# Patient Record
Sex: Female | Born: 1950 | Race: White | Hispanic: No | Marital: Single | Smoking: Never smoker
Health system: Southern US, Community
[De-identification: ages and names within clinical notes are randomized; demographics above are authoritative.]

## PROBLEM LIST (undated history)

## (undated) DIAGNOSIS — I639 Cerebral infarction, unspecified: Secondary | ICD-10-CM

## (undated) DIAGNOSIS — I10 Essential (primary) hypertension: Secondary | ICD-10-CM

## (undated) DIAGNOSIS — R519 Headache, unspecified: Secondary | ICD-10-CM

## (undated) DIAGNOSIS — H539 Unspecified visual disturbance: Secondary | ICD-10-CM

## (undated) DIAGNOSIS — R51 Headache: Secondary | ICD-10-CM

## (undated) HISTORY — DX: Headache: R51

## (undated) HISTORY — PX: CHOLECYSTECTOMY: SHX55

## (undated) HISTORY — DX: Headache, unspecified: R51.9

## (undated) HISTORY — DX: Cerebral infarction, unspecified: I63.9

## (undated) HISTORY — PX: PARTIAL HYSTERECTOMY: SHX80

## (undated) HISTORY — DX: Essential (primary) hypertension: I10

## (undated) HISTORY — DX: Unspecified visual disturbance: H53.9

---

## 2015-02-03 DIAGNOSIS — M7520 Bicipital tendinitis, unspecified shoulder: Secondary | ICD-10-CM | POA: Insufficient documentation

## 2015-03-17 DIAGNOSIS — E668 Other obesity: Secondary | ICD-10-CM | POA: Insufficient documentation

## 2015-03-22 ENCOUNTER — Ambulatory Visit (INDEPENDENT_AMBULATORY_CARE_PROVIDER_SITE_OTHER): Payer: Medicare Other | Admitting: Neurology

## 2015-03-22 ENCOUNTER — Ambulatory Visit (INDEPENDENT_AMBULATORY_CARE_PROVIDER_SITE_OTHER): Payer: Self-pay | Admitting: *Deleted

## 2015-03-22 ENCOUNTER — Encounter: Payer: Self-pay | Admitting: Neurology

## 2015-03-22 VITALS — BP 130/90 | HR 66 | Resp 20 | Ht 60.0 in | Wt 236.4 lb

## 2015-03-22 DIAGNOSIS — G43701 Chronic migraine without aura, not intractable, with status migrainosus: Secondary | ICD-10-CM

## 2015-03-22 DIAGNOSIS — R51 Headache: Secondary | ICD-10-CM

## 2015-03-22 DIAGNOSIS — R413 Other amnesia: Secondary | ICD-10-CM | POA: Diagnosis not present

## 2015-03-22 DIAGNOSIS — R519 Headache, unspecified: Secondary | ICD-10-CM

## 2015-03-22 DIAGNOSIS — G4733 Obstructive sleep apnea (adult) (pediatric): Secondary | ICD-10-CM | POA: Diagnosis not present

## 2015-03-22 DIAGNOSIS — R9089 Other abnormal findings on diagnostic imaging of central nervous system: Secondary | ICD-10-CM | POA: Insufficient documentation

## 2015-03-22 DIAGNOSIS — R930 Abnormal findings on diagnostic imaging of skull and head, not elsewhere classified: Secondary | ICD-10-CM

## 2015-03-22 DIAGNOSIS — Z9989 Dependence on other enabling machines and devices: Secondary | ICD-10-CM

## 2015-03-22 DIAGNOSIS — F4323 Adjustment disorder with mixed anxiety and depressed mood: Secondary | ICD-10-CM | POA: Insufficient documentation

## 2015-03-22 MED ORDER — KETOROLAC TROMETHAMINE 30 MG/ML IJ SOLN
60.0000 mg | Freq: Once | INTRAMUSCULAR | Status: AC
  Administered 2015-03-22: 60 mg via INTRAMUSCULAR

## 2015-03-22 MED ORDER — TOPIRAMATE 100 MG PO TABS: 200.0000 mg | ORAL_TABLET | Freq: Every day | ORAL | Status: DC

## 2015-03-22 NOTE — Progress Notes (Signed)
[facility]  PATIENT: Jeanne White DOB: [birth year]  REFERRING DOCTOR OR PCP:  Brooke Bonito SOURCE: Patient and records from [facility]  _________________________________   HISTORICAL  CHIEF COMPLAINT:  Chief Complaint  Patient presents with  . Cerebrovascular Accident    Former pt. of Dr. Bonnita Hollow from [facility].  She denies new stroke sx.  Sts. memory is ok right now.  Today she c/o frequent, almost daily h/a's.  Sts. weather changes, hot or cold air trigger h/a's.  She sts. Trokendi has helped in the past, (more than Topirimate), but she stopped it due to the $50 copay.  She would like to discuss restarting this or another daily preventative./fim  . Memory Loss  . Headaches    HISTORY OF PRESENT ILLNESS:  Jeanne White is a 64 yo woman with migraine headaches, memory loss and a very abnormal MRI of the brain.  Migraines:   She has had headaches her entire adult life, worse over the past 10 years, right more than left.   Her right ear often hurts when her pain is worse.   Often, the headache is mild in the morning and intensifies as the day goes on.   Vision becomes blurry when pain is worse.   Moving her head or looking down will worsen the pain.   In the past, Trokendi worked fairly well and definitely better than immediate release topiramate.   Due to cost, she is back on topiramate but is only taking a couple days a week.   Weather / temp changes will trigger migraines or intensify existing pain.    Memory loss has been more of a problem the past few years.   She also had trouble comprehending at work and stopped in 2014.   She feels this continues to slowly worsen.     Mood:  She notes mild depression at times but more issues with anxiety and has occasional panic attacks  In 2007, she was diagnosed with Bells palsy.   She had an MRI showing severe white matter changes and was referred to Dr. Leotis Shames who diagnosed her with MS  and started Copaxone.   She felt worse and began to see me in 2009 or so.   In the past, I reviewed her MRI images from 04/02/2014. There is cortical and cerebellar atrophy and extensive white matter hyperintensity. There is some progression compared to the MRI of the brain dated 08/24/2010.  Notch 3 (CADASIL) an COL4A1 testing were negative.  Interestingly, her father has hereditary hemorrhagic telangiectasia  She also has OSA treated with CPAP  REVIEW OF SYSTEMS: Constitutional: No fevers, chills, sweats, or change in appetite Eyes: No visual changes, double vision, eye pain Ear, nose and throat: No hearing loss, ear pain, nasal congestion, sore throat Cardiovascular: No chest pain, palpitations Respiratory: No shortness of breath at rest or with exertion.   No wheezes GastrointestinaI: No nausea, vomiting, diarrhea, abdominal pain, fecal incontinence Genitourinary: No dysuria, urinary retention or frequency.  No nocturia. Musculoskeletal: No neck pain, back pain Integumentary: No rash, pruritus, skin lesions Neurological: as above Psychiatric: No depression at this time.  No anxiety Endocrine: No palpitations, diaphoresis, change in appetite, change in weigh or increased thirst Hematologic/Lymphatic: No anemia, purpura, petechiae. Allergic/Immunologic: No itchy/runny eyes, nasal congestion, recent allergic reactions, rashes  ALLERGIES: No Known Allergies  HOME MEDICATIONS:  Current outpatient prescriptions:  .  amLODipine (NORVASC) 5 MG tablet, TK 1 T PO  D FOR BLOOD PRESSURE, Disp: , Rfl: 11 .  hydrochlorothiazide (HYDRODIURIL) 25 MG tablet, TK 1 T PO  D, Disp: , Rfl: 5 .  levothyroxine (SYNTHROID, LEVOTHROID) 150 MCG tablet, Take 150 mcg by mouth daily before breakfast., Disp: , Rfl:  .  losartan (COZAAR) 25 MG tablet, TK 1 T PO  QD, Disp: , Rfl: 0  PAST MEDICAL HISTORY: Past Medical History  Diagnosis Date  . Headache   . Hypertension   . Stroke   . Vision abnormalities      PAST SURGICAL HISTORY: Past Surgical History  Procedure Laterality Date  . Partial hysterectomy    . Cholecystectomy      FAMILY HISTORY: Family History  Problem Relation Age of Onset  . Heart disease Mother   . Hypertension Father   . Heart disease Father     SOCIAL HISTORY:  History   Social History  . Marital Status: Single    Spouse Name: N/A  . Number of Children: N/A  . Years of Education: N/A   Occupational History  . Not on file.   Social History Main Topics  . Smoking status: Never Smoker   . Smokeless tobacco: Not on file  . Alcohol Use: No  . Drug Use: No  . Sexual Activity: Not on file   Other Topics Concern  . Not on file   Social History Narrative  . No narrative on file     PHYSICAL EXAM  Filed Vitals:   03/22/15 1438  BP: 130/90  Pulse: 66  Resp: 20  Height: 5' (1.524 m)  Weight: 236 lb 6.4 oz (107.23 kg)    Body mass index is 46.17 kg/(m^2).   General: The patient is well-developed and well-nourished and in no acute distress  Eyes:  Funduscopic exam shows normal optic discs and retinal vessels.  Neck: The neck is supple, no carotid bruits are noted.  The neck is nontender.  Cardiovascular: The heart has a regular rate and rhythm with a normal S1 and S2. There were no murmurs, gallops or rubs. Lungs are clear to auscultation.  Skin: Extremities are without significant edema.  Musculoskeletal:  Back is nontender  Neurologic Exam  Mental status: The patient is alert and oriented x 3 at the time of the examination. The patient has apparent normal recent and remote memory, with an apparently normal attention span and concentration ability.   Speech is normal.  Cranial nerves: Extraocular movements are full. Pupils are equal, round, and reactive to light and accomodation.  Visual fields are full.  Facial symmetry is present. There is good facial sensation to soft touch bilaterally. Facial strength is normal.  Trapezius and  sternocleidomastoid strength is normal. No dysarthria is noted.  The tongue is midline, and the patient has symmetric elevation of the soft palate. No obvious hearing deficits are noted.  Motor:  Muscle bulk is normal.   Tone is normal. Strength is  5 / 5 in all 4 extremities.   Sensory: Sensory testing is intact to pinprick, soft touch and vibration sensation in all 4 extremities.  Coordination: Cerebellar testing reveals good finger-nose-finger and heel-to-shin bilaterally.  Gait and station: Station is normal.   Gait is normal. Tandem gait is wide. Romberg is negative.   Reflexes: Deep tendon reflexes are symmetric and normal bilaterally.   Plantar responses are flexor.    DIAGNOSTIC DATA (LABS, IMAGING, TESTING) - I reviewed patient records, labs, notes, testing and imaging myself where available.     ASSESSMENT AND PLAN  Chronic migraine without aura with status migrainosus,  not intractable  Memory loss  Abnormal brain MRI  Adjustment disorder with mixed anxiety and depressed mood  OSA on CPAP    1.   Topiramate 200 mg by mouth nightly for chronic migraine. If she experiences worsening of her memory difficulties, consider changing to trochanter deep. 2.  Aspirin 81 mg by mouth daily 3.  She will return to see me in 6 months or sooner if she has new or worsening neurologic symptoms. We will check an MRI of the brain around the time of her next visit.  45 minute face-to-face evaluation with greater than one half of the time counseling according care about her symptoms white matter disease and possible genetic component. toradol Mri next visit   Madalina Rosman A. Epimenio FootSater, MD, PhD 03/22/2015, 2:49 PM Certified in Neurology, Clinical Neurophysiology, Sleep Medicine, Pain Medicine and Neuroimaging  [facility] [address], [address] [place], [place] [address] [phone]

## 2015-03-22 NOTE — Progress Notes (Signed)
Toradol  IM given RUOG/fim

## 2015-09-22 ENCOUNTER — Ambulatory Visit: Payer: Medicare Other | Admitting: Neurology

## 2015-09-29 ENCOUNTER — Encounter: Payer: Self-pay | Admitting: Neurology

## 2015-09-29 ENCOUNTER — Ambulatory Visit (INDEPENDENT_AMBULATORY_CARE_PROVIDER_SITE_OTHER): Payer: Medicare Other | Admitting: Neurology

## 2015-09-29 VITALS — BP 140/86 | HR 74 | Resp 16 | Ht 60.0 in | Wt 222.6 lb

## 2015-09-29 DIAGNOSIS — Z9989 Dependence on other enabling machines and devices: Secondary | ICD-10-CM

## 2015-09-29 DIAGNOSIS — G43701 Chronic migraine without aura, not intractable, with status migrainosus: Secondary | ICD-10-CM

## 2015-09-29 DIAGNOSIS — R930 Abnormal findings on diagnostic imaging of skull and head, not elsewhere classified: Secondary | ICD-10-CM

## 2015-09-29 DIAGNOSIS — G4733 Obstructive sleep apnea (adult) (pediatric): Secondary | ICD-10-CM

## 2015-09-29 DIAGNOSIS — R9089 Other abnormal findings on diagnostic imaging of central nervous system: Secondary | ICD-10-CM

## 2015-09-29 DIAGNOSIS — R413 Other amnesia: Secondary | ICD-10-CM

## 2015-09-29 MED ORDER — TOPIRAMATE ER 200 MG PO CAP24: ORAL_CAPSULE | ORAL | Status: DC

## 2015-09-29 NOTE — Progress Notes (Signed)
[facility]  PATIENT: Jeanne White DOB: [birth year]  REFERRING DOCTOR OR PCP:  Brooke Bonito SOURCE: Patient and records from [facility]  _________________________________   HISTORICAL  CHIEF COMPLAINT:  Chief Complaint  Patient presents with  . History of CVA    Denies new stroke sx.  Sts. she has not noted a difference in h/a's since starting Topamax.  Sts. h/a's are almost daily, about the same severity.  She believes h/a's are better if she stays inside, worse if she goes outside./fim  . Memory Loss  . Migraines    HISTORY OF PRESENT ILLNESS:  Jeanne White is a 66 yo woman with migraine headaches, memory loss and a very abnormal MRI of the brain.  Migraines:   She has had headaches her entire adult life, worse over the past 10 years, right more than left.    Headaches are daily.   She often wakes up with one.  Vision is sometimes blurry before headache and sometimes becomes blurry when pain is worse.   Moving her head or looking down will worsen the pain. She often gets nausea but rarely has vomiting.   Trokendi worked much better than topiramate (200 mg nightly).   She gets much more sleepy with the topiramate.      Weather / temp changes will trigger migraines or intensify existing pain.     Imitrex and other triptans have not helped.   Excedrin migraine sometimes helps a short time.     Memory loss is still a problem and has worsened.   .   She also had trouble comprehending at work and stopped in 2014.  She started a part time job but has some problems at times.   She feels memory problems continue to slowly worsen.     Mood:  She notes mild depression at times but more issues with anxiety and has occasional panic attacks.  She had a recent panic attack at a part time job, lasting about 15-30 minutes.    She feels she can't remember anything when the panic is there.     History:   In 2007, she was diagnosed with Bells palsy.   She had an  MRI showing severe white matter changes and was referred to Dr. Leotis Shames who diagnosed her with MS and started Copaxone.   She felt worse and began to see me in 2009 or so.   In the past, I reviewed her MRI images from 04/02/2014. There is cortical and cerebellar atrophy and extensive white matter hyperintensity. There is some progression compared to the MRI of the brain dated 08/24/2010.  Notch 3 (CADASIL) an COL4A1 testing were negative.  Interestingly, her father has hereditary hemorrhagic telangiectasia  She also has OSA treated with CPAP  REVIEW OF SYSTEMS: Constitutional: No fevers, chills, sweats, or change in appetite Eyes: No visual changes, double vision, eye pain Ear, nose and throat: No hearing loss, ear pain, nasal congestion, sore throat Cardiovascular: No chest pain, palpitations Respiratory: No shortness of breath at rest or with exertion.   No wheezes GastrointestinaI: No nausea, vomiting, diarrhea, abdominal pain, fecal incontinence Genitourinary: No dysuria, urinary retention or frequency.  No nocturia. Musculoskeletal: No neck pain, back pain Integumentary: No rash, pruritus, skin lesions Neurological: as above Psychiatric: No depression at this time.  No anxiety Endocrine: No palpitations, diaphoresis, change in appetite, change in weigh or increased thirst Hematologic/Lymphatic: No anemia, purpura, petechiae. Allergic/Immunologic: No itchy/runny eyes, nasal congestion, recent allergic reactions, rashes  ALLERGIES: No  Known Allergies  HOME MEDICATIONS:  Current outpatient prescriptions:  .  amLODipine (NORVASC) 5 MG tablet, TK 1 T PO  D FOR BLOOD PRESSURE, Disp: , Rfl: 11 .  hydrochlorothiazide (HYDRODIURIL) 25 MG tablet, TK 1 T PO  D, Disp: , Rfl: 5 .  levothyroxine (SYNTHROID, LEVOTHROID) 150 MCG tablet, Take 150 mcg by mouth daily before breakfast., Disp: , Rfl:  .  losartan (COZAAR) 25 MG tablet, TK 1 T PO  QD, Disp: , Rfl: 0 .  topiramate (TOPAMAX) 100 MG  tablet, Take 2 tablets (200 mg total) by mouth at bedtime., Disp: 60 tablet, Rfl: 11  PAST MEDICAL HISTORY: Past Medical History  Diagnosis Date  . Headache   . Hypertension   . Stroke (HCC)   . Vision abnormalities     PAST SURGICAL HISTORY: Past Surgical History  Procedure Laterality Date  . Partial hysterectomy    . Cholecystectomy      FAMILY HISTORY: Family History  Problem Relation Age of Onset  . Heart disease Mother   . Hypertension Father   . Heart disease Father     SOCIAL HISTORY:  Social History   Social History  . Marital Status: Single    Spouse Name: N/A  . Number of Children: N/A  . Years of Education: N/A   Occupational History  . Not on file.   Social History Main Topics  . Smoking status: Never Smoker   . Smokeless tobacco: Not on file  . Alcohol Use: No  . Drug Use: No  . Sexual Activity: Not on file   Other Topics Concern  . Not on file   Social History Narrative     PHYSICAL EXAM  Filed Vitals:   09/29/15 1528  BP: 140/86  Pulse: 74  Resp: 16  Height: 5' (1.524 m)  Weight: 222 lb 9.6 oz (100.971 kg)    Body mass index is 43.47 kg/(m^2).   General: The patient is well-developed and well-nourished and in no acute distress  Neck: The neck is mildly tender.  Skin: Extremities are without significant edema.  Neurologic Exam  Mental status: The patient is alert and oriented x 3 at the time of the examination. The patient has apparent normal recent and remote memory, with an apparently normal attention span and concentration ability.   Speech is normal.  Cranial nerves: Extraocular movements are full. Pupils are equal, round, and reactive to light and accomodation.   There is good facial sensation to soft touch bilaterally. Facial strength is normal.  Trapezius and sternocleidomastoid strength is normal. No dysarthria is noted.  The tongue is midline, and the patient has symmetric elevation of the soft palate. No obvious  hearing deficits are noted.  Motor:  Muscle bulk is normal.   Tone is normal. Strength is  5 / 5 in all 4 extremities.   Sensory: Sensory testing is intact to touch and vibration sensation in all 4 extremities.  Coordination: Cerebellar testing reveals good finger-nose-finger and heel-to-shin bilaterally.  Gait and station: Station is normal.   Gait is normal. Tandem gait is wide. Romberg is negative.   Reflexes: Deep tendon reflexes are symmetric and normal bilaterally.  Marland Kitchen.    DIAGNOSTIC DATA (LABS, IMAGING, TESTING) - I reviewed patient records, labs, notes, testing and imaging myself where available.     ASSESSMENT AND PLAN  Chronic migraine without aura with status migrainosus, not intractable  OSA on CPAP  Abnormal brain MRI  Memory loss    1.  Change topiramate to Trokendi XR.   If not covered, consider Keppra 2.   Aspirin 81 mg by mouth daily 3.  Continue CPAP 4.   She will return to see me in 6 months or sooner if she has new or worsening neurologic symptoms. We will check an MRI of the brain around the time of her next visit.   Fannye Myer A. Epimenio Foot, MD, PhD 09/29/2015, 3:32 PM Certified in Neurology, Clinical Neurophysiology, Sleep Medicine, Pain Medicine and Neuroimaging  [facility] [address], [address] [place], [place] [address] [phone]

## 2015-09-30 ENCOUNTER — Telehealth: Payer: Self-pay | Admitting: Neurology

## 2015-09-30 MED ORDER — LEVETIRACETAM 750 MG PO TABS: 750.0000 mg | ORAL_TABLET | Freq: Two times a day (BID) | ORAL | Status: DC

## 2015-09-30 NOTE — Telephone Encounter (Signed)
Pt called and says the drug company will be faxing a form for the physician to fill out for the pt. She can not afford the medication and would like to know if something else can be prescribed until the form is filled out and sent back. Please call and advise [phone]

## 2015-09-30 NOTE — Telephone Encounter (Signed)
LMOM that per RAS, will switch her to Keppra 750mg  po bid.  Rx. escribed to [facility] in [place].  Pt. does not need to return this call unless she has questions/fim

## 2015-10-03 DIAGNOSIS — I10 Essential (primary) hypertension: Secondary | ICD-10-CM | POA: Insufficient documentation

## 2015-10-03 DIAGNOSIS — F329 Major depressive disorder, single episode, unspecified: Secondary | ICD-10-CM | POA: Insufficient documentation

## 2015-10-03 DIAGNOSIS — J45909 Unspecified asthma, uncomplicated: Secondary | ICD-10-CM | POA: Insufficient documentation

## 2015-10-03 DIAGNOSIS — E039 Hypothyroidism, unspecified: Secondary | ICD-10-CM | POA: Insufficient documentation

## 2015-10-14 NOTE — Telephone Encounter (Signed)
Patient called regarding levETIRAcetam (KEPPRA) 750 MG tablet, states the medication is helping her headaches, however she has been on KEPPRA x 3 weeks and has side effect of staggering when she gets up, she is working part time at [employer] and Monday she was staggering so bad, she had to have someone come get her. Wonders if she could cut back to 1/2 dose. Please call to advise [phone].

## 2015-10-14 NOTE — Telephone Encounter (Signed)
I have spoken with Jeanne White this afternoon and per RAS, advised she decrease Keppra to 1/2 tab bid.  She is agreeable with this plan, and will call me back if se do not improved/fim

## 2015-12-05 ENCOUNTER — Ambulatory Visit (INDEPENDENT_AMBULATORY_CARE_PROVIDER_SITE_OTHER): Payer: Medicare Other | Admitting: *Deleted

## 2015-12-05 ENCOUNTER — Telehealth: Payer: Self-pay | Admitting: Neurology

## 2015-12-05 DIAGNOSIS — R51 Headache: Secondary | ICD-10-CM

## 2015-12-05 DIAGNOSIS — R519 Headache, unspecified: Secondary | ICD-10-CM

## 2015-12-05 MED ORDER — KETOROLAC TROMETHAMINE 60 MG/2ML IM SOLN
60.0000 mg | Freq: Once | INTRAMUSCULAR | Status: AC
Start: 2015-12-05 — End: 2015-12-05
  Administered 2015-12-05: 60 mg via INTRAMUSCULAR

## 2015-12-05 NOTE — Telephone Encounter (Signed)
I have spoken with Jeanne White this morning, and per RAS ok, have given appt. for T60 IM 3pm today.  Added to RN schedule/fim

## 2015-12-05 NOTE — Telephone Encounter (Signed)
Pt called sts HA's have increased in frequency about 1 mth ago. sts HA's had improved but she thinks the weather change had caused HA's to get worse. She is asking for injection. Please call

## 2016-03-29 ENCOUNTER — Encounter: Payer: Self-pay | Admitting: Neurology

## 2016-03-29 ENCOUNTER — Ambulatory Visit (INDEPENDENT_AMBULATORY_CARE_PROVIDER_SITE_OTHER): Payer: Medicare Other | Admitting: Neurology

## 2016-03-29 VITALS — BP 128/76 | HR 74 | Resp 16 | Ht 60.0 in | Wt 237.0 lb

## 2016-03-29 DIAGNOSIS — F4323 Adjustment disorder with mixed anxiety and depressed mood: Secondary | ICD-10-CM | POA: Diagnosis not present

## 2016-03-29 DIAGNOSIS — R413 Other amnesia: Secondary | ICD-10-CM | POA: Diagnosis not present

## 2016-03-29 DIAGNOSIS — G43701 Chronic migraine without aura, not intractable, with status migrainosus: Secondary | ICD-10-CM

## 2016-03-29 DIAGNOSIS — G4733 Obstructive sleep apnea (adult) (pediatric): Secondary | ICD-10-CM

## 2016-03-29 DIAGNOSIS — M542 Cervicalgia: Secondary | ICD-10-CM | POA: Diagnosis not present

## 2016-03-29 DIAGNOSIS — Z9989 Dependence on other enabling machines and devices: Principal | ICD-10-CM

## 2016-03-29 MED ORDER — CYCLOBENZAPRINE HCL 5 MG PO TABS: 5.0000 mg | ORAL_TABLET | Freq: Every day | ORAL | Status: DC

## 2016-03-29 NOTE — Progress Notes (Signed)
[facility]  PATIENT: Jeanne White DOB: September 23, 1950  REFERRING DOCTOR OR PCP:  Brooke Bonito SOURCE: Patient and records from [facility]  _________________________________   HISTORICAL  CHIEF COMPLAINT:  Chief Complaint  Patient presents with  . Sleep Apnea    Sts. she remains compliant with CPAP. Sts. h/a's are not any better with Keppra./fim  . History of CVA    HISTORY OF PRESENT ILLNESS:  Jeanne White is a 65 yo woman with migraine headaches, memory loss and a very abnormal MRI of the brain.  She is getting migraines daily now and is feeling much worse.      Migraines:   She is reporting daily headaches now (30/30 days) with pain > 4 hours a day.   She wakes up with pain most (but not all) days.   She has had headaches her entire adult life, worse over the past 10 years, right more than left.  Vision is sometimes blurry before headache and sometimes becomes blurry when pain is worse.   Moving her head or looking down will worsen the pain. She often gets nausea but rarely has vomiting.   Keppra has helped a little bit.   Heat (air) or wind/air will  trigger migraines or intensify existing pain.  A heating pad sometimes     Imitrex and other triptans have not helped.   Excedrin migraine sometimes helps a short time.     Medications tried and failed for chronic Migraine:    Antiepileptics (Topamax, Keppra).  Due to obesity can not go on Depakote. Beta blockers (metoprolol); Calcium channel blockers (Norvasc) Antidepressants (Lexapro, others in past) NSAIDs - ibuprofen, naproxen, Excedrin Migraine Triptans - Imitrex, Relpax  Memory:    Memory loss is still a problem and has worsened over the past couple years.   .   She had trouble comprehending at work and stopped in 2014.  She started a part time job but had some problems at times.   She feels memory problems continue to slowly worsen.    She also notes decreased focus and  attention.  Mood:  She notes mild depression at times and mild anxiety and has occasional panic attacks.  She gets upset and frustrated easily.    No panic attacks since las visit.   She feels she can't remember anything when more anxious.     OSA:   She also has OSA treated with CPAP.  She is very compliant and uses it nightly. She sleeps much better when she wears it than when she does not.  Abnormal MRI History:   In 2007, she was diagnosed with Bells palsy.   She had an MRI showing severe white matter changes and was referred to Dr. Leotis Shames who diagnosed her with MS and started Copaxone.   She felt worse and began to see me in 2009 or so.   In the past, I reviewed her MRI images from 04/02/2014. There is cortical and cerebellar atrophy and extensive white matter hyperintensity. There is some progression compared to the MRI of the brain dated 08/24/2010.  Notch 3 (CADASIL) an COL4A1 testing were negative.  Interestingly, her father has hereditary hemorrhagic telangiectasia   REVIEW OF SYSTEMS: Constitutional: No fevers, chills, sweats, or change in appetite Eyes: No visual changes, double vision, eye pain Ear, nose and throat: No hearing loss, ear pain, nasal congestion, sore throat Cardiovascular: No chest pain, palpitations Respiratory: No shortness of breath at rest or with exertion.   No wheezes GastrointestinaI:  No nausea, vomiting, diarrhea, abdominal pain, fecal incontinence Genitourinary: No dysuria, urinary retention or frequency.  No nocturia. Musculoskeletal: No neck pain, back pain Integumentary: No rash, pruritus, skin lesions Neurological: as above Psychiatric: No depression at this time.  No anxiety Endocrine: No palpitations, diaphoresis, change in appetite, change in weigh or increased thirst Hematologic/Lymphatic: No anemia, purpura, petechiae. Allergic/Immunologic: No itchy/runny eyes, nasal congestion, recent allergic reactions, rashes  ALLERGIES: Allergies   Allergen Reactions  . Mometasone Other (See Comments)    Unknown    HOME MEDICATIONS:  Current outpatient prescriptions:  .  amLODipine (NORVASC) 5 MG tablet, TK 1 T PO  D FOR BLOOD PRESSURE, Disp: , Rfl: 11 .  hydrochlorothiazide (HYDRODIURIL) 25 MG tablet, TK 1 T PO  D, Disp: , Rfl: 5 .  levETIRAcetam (KEPPRA) 750 MG tablet, Take 1 tablet (750 mg total) by mouth 2 (two) times daily., Disp: 120 tablet, Rfl: 11 .  levothyroxine (SYNTHROID, LEVOTHROID) 150 MCG tablet, Take 150 mcg by mouth daily before breakfast., Disp: , Rfl:  .  losartan (COZAAR) 25 MG tablet, TK 1 T PO  QD, Disp: , Rfl: 0  PAST MEDICAL HISTORY: Past Medical History  Diagnosis Date  . Headache   . Hypertension   . Stroke (HCC)   . Vision abnormalities     PAST SURGICAL HISTORY: Past Surgical History  Procedure Laterality Date  . Partial hysterectomy    . Cholecystectomy      FAMILY HISTORY: Family History  Problem Relation Age of Onset  . Heart disease Mother   . Hypertension Father   . Heart disease Father     SOCIAL HISTORY:  Social History   Social History  . Marital Status: Single    Spouse Name: N/A  . Number of Children: N/A  . Years of Education: N/A   Occupational History  . Not on file.   Social History Main Topics  . Smoking status: Never Smoker   . Smokeless tobacco: Not on file  . Alcohol Use: No  . Drug Use: No  . Sexual Activity: Not on file   Other Topics Concern  . Not on file   Social History Narrative     PHYSICAL EXAM  Filed Vitals:   03/29/16 1607  BP: 128/76  Pulse: 74  Resp: 16  Height: 5' (1.524 m)  Weight: 237 lb (107.502 kg)    Body mass index is 46.29 kg/(m^2).   General: The patient is well-developed and well-nourished and in no acute distress  Neck: The neck is mildly tender.  Skin: Extremities are without significant edema.  Neurologic Exam  Mental status: The patient is alert and oriented x 3 at the time of the examination. The  patient has apparent normal recent and remote memory, with an apparently normal attention span and concentration ability.   Speech is normal.  Cranial nerves: Extraocular movements are full. Pupils are equal, round, and reactive to light and accomodation.   There is good facial sensation to soft touch bilaterally. Facial strength is normal.  Trapezius and sternocleidomastoid strength is normal. No dysarthria is noted.  The tongue is midline, and the patient has symmetric elevation of the soft palate. No obvious hearing deficits are noted.  Motor:  Muscle bulk is normal.   Tone is normal. Strength is  5 / 5 in all 4 extremities.   Sensory: Sensory testing is intact to touch and vibration sensation in all 4 extremities.  Coordination: Cerebellar testing reveals good finger-nose-finger and heel-to-shin bilaterally.  Gait and station: Station is normal.   Gait is is minimally wide. Tandem gait is wide. Romberg is negative.   Reflexes: Deep tendon reflexes are increased in legs bilaterally.  Marland Kitchen.    DIAGNOSTIC DATA (LABS, IMAGING, TESTING) - I reviewed patient records, labs, notes, testing and imaging myself where available.     ASSESSMENT AND PLAN  OSA on CPAP  Chronic migraine without aura with status migrainosus, not intractable  Adjustment disorder with mixed anxiety and depressed mood  Memory loss  Neck pain    1.   Continue Keppra.   Add cyclobenzaprine to help pain / sleep 2.   Aspirin 81 mg by mouth daily 3.   Continue CPAP 4.   Bilateral splenius capitis muscle injection with 80 mg Depo-Medrol in marcaine. 5.   We discussed Botox for her chronic migriane refractory to treatment with other medications.    We will go ahead and have her sign out. 6.   She will return to see me in 4 months or sooner if she has new or worsening neurologic symptoms. If she has more headaches or memory decline consider a an MRI at next visit   45 minute face-to-face evaluation with greater than  one half of the time counseling or coordinating care about her headaches and memory loss.  Shemia Bevel A. Epimenio FootSater, MD, PhD 03/29/2016, 4:10 PM Certified in Neurology, Clinical Neurophysiology, Sleep Medicine, Pain Medicine and Neuroimaging  [facility] [address], [address] [place], [place] [id number] [phone]

## 2016-05-03 ENCOUNTER — Telehealth: Payer: Self-pay | Admitting: Neurology

## 2016-05-03 NOTE — Telephone Encounter (Signed)
Pt called wanting to know the status of the insurance approval for botox. Please call

## 2016-05-07 NOTE — Telephone Encounter (Signed)
Pt called in to check on status of botox . Please call pt

## 2016-05-08 NOTE — Telephone Encounter (Signed)
Called patient and schedule apt.  °

## 2016-05-16 ENCOUNTER — Encounter: Payer: Self-pay | Admitting: Neurology

## 2016-05-16 ENCOUNTER — Ambulatory Visit (INDEPENDENT_AMBULATORY_CARE_PROVIDER_SITE_OTHER): Payer: Medicare Other | Admitting: Neurology

## 2016-05-16 VITALS — BP 158/82 | HR 84 | Resp 18 | Ht 60.0 in | Wt 240.0 lb

## 2016-05-16 DIAGNOSIS — R9089 Other abnormal findings on diagnostic imaging of central nervous system: Secondary | ICD-10-CM

## 2016-05-16 DIAGNOSIS — F411 Generalized anxiety disorder: Secondary | ICD-10-CM | POA: Diagnosis not present

## 2016-05-16 DIAGNOSIS — G43701 Chronic migraine without aura, not intractable, with status migrainosus: Secondary | ICD-10-CM | POA: Diagnosis not present

## 2016-05-16 DIAGNOSIS — G4733 Obstructive sleep apnea (adult) (pediatric): Secondary | ICD-10-CM | POA: Diagnosis not present

## 2016-05-16 DIAGNOSIS — R930 Abnormal findings on diagnostic imaging of skull and head, not elsewhere classified: Secondary | ICD-10-CM

## 2016-05-16 DIAGNOSIS — Z9989 Dependence on other enabling machines and devices: Secondary | ICD-10-CM

## 2016-05-16 DIAGNOSIS — R413 Other amnesia: Secondary | ICD-10-CM

## 2016-05-16 NOTE — Progress Notes (Signed)
[facility]  PATIENT: Jeanne White DOB: 05-13-1951  REFERRING DOCTOR OR PCP:  Brooke BonitoWarren Gallemore SOURCE: Patient and records from [facility]  _________________________________   HISTORICAL  CHIEF COMPLAINT:  Chief Complaint  Patient presents with  . Migraines    Here today for Botox inj/fim    HISTORY OF PRESENT ILLNESS:  Jeanne White is a 65 yo woman with chronic migraine headaches.   She also has memory loss and a very abnormal MRI of the brain.     Migraines:   Currently, she is reporting daily headaches (30/30 days) with pain > 4 hours a day. Headaches are usually worse on the right than the left  She wakes up with pain most days.   She has had headaches X 40-50 years, worse over the past 10 years, right more than left.  Vision is sometimes blurry before headache and sometimes becomes blurry when pain is worse.   Moving her head or looking down will worsen the pain. She often gets nausea but rarely has vomiting.   Keppra has helped a little bit.   Heat and wind  Triggers a migraine or intensifies existing pain.    Imitrex and other triptans have not helped.   Excedrin migraine sometimes helps a short time.   An occipital nerve block/splenius capitis muscle injection helped the pain for a week or 2.  Medications tried and failed for chronic Migraine:    Antiepileptics (Topamax, Keppra).  Due to obesity can not go on Depakote. Beta blockers (metoprolol); Calcium channel blockers (Norvasc) Antidepressants (Lexapro, others in past) NSAIDs - ibuprofen, naproxen, Excedrin Migraine Triptans - Imitrex, Relpax  Memory:    She feels about the same as her last visit though memory has worsened over the past couple of years. She stopped working in 2014 due to memory difficulties.  She feels memory problems are progressive.    She also notes decreased focus and attention.  Mood:  She notes mild depression and moderate anxiety with panic attacks.  She gets  upset and frustrated easily.     OSA:   She also has OSA treated with CPAP.  She is very compliant and uses it nightly. She sleeps much better when she wears it than when she does not.  Abnormal MRI History:   In 2007, she was diagnosed with Bells palsy.   She had an MRI showing severe white matter changes and was referred to Dr. Leotis ShamesJeffery who diagnosed her with MS and started Copaxone.   She felt worse and began to see me in 2009 or so.   In the past, I reviewed her MRI images from 04/02/2014. There is cortical and cerebellar atrophy and extensive white matter hyperintensity. There is some progression compared to the MRI of the brain dated 08/24/2010.  Notch 3 (CADASIL) and COL4A1 testing were negative.  Her father has hereditary hemorrhagic telangiectasia   REVIEW OF SYSTEMS: Constitutional: No fevers, chills, sweats, or change in appetite Eyes: No visual changes, double vision, eye pain Ear, nose and throat: No hearing loss, ear pain, nasal congestion, sore throat Cardiovascular: No chest pain, palpitations Respiratory: No shortness of breath at rest or with exertion.   No wheezes GastrointestinaI: No nausea, vomiting, diarrhea, abdominal pain, fecal incontinence Genitourinary: No dysuria, urinary retention or frequency.  No nocturia. Musculoskeletal: No neck pain, back pain Integumentary: No rash, pruritus, skin lesions Neurological: as above Psychiatric: No depression at this time.  No anxiety Endocrine: No palpitations, diaphoresis, change in appetite, change in weigh  or increased thirst Hematologic/Lymphatic: No anemia, purpura, petechiae. Allergic/Immunologic: No itchy/runny eyes, nasal congestion, recent allergic reactions, rashes  ALLERGIES: Allergies  Allergen Reactions  . Mometasone Other (See Comments)    Unknown    HOME MEDICATIONS:  Current Outpatient Prescriptions:  .  amLODipine (NORVASC) 5 MG tablet, TK 1 T PO  D FOR BLOOD PRESSURE, Disp: , Rfl: 11 .   cyclobenzaprine (FLEXERIL) 5 MG tablet, Take 1 tablet (5 mg total) by mouth at bedtime., Disp: 30 tablet, Rfl: 5 .  escitalopram (LEXAPRO) 20 MG tablet, Take 20 mg by mouth daily., Disp: , Rfl:  .  hydrochlorothiazide (HYDRODIURIL) 25 MG tablet, TK 1 T PO  D, Disp: , Rfl: 5 .  levothyroxine (SYNTHROID, LEVOTHROID) 150 MCG tablet, Take 150 mcg by mouth daily before breakfast., Disp: , Rfl:  .  losartan (COZAAR) 25 MG tablet, TK 1 T PO  QD, Disp: , Rfl: 0 .  metoprolol succinate (TOPROL-XL) 100 MG 24 hr tablet, Take 100 mg by mouth daily. Take with or immediately following a meal., Disp: , Rfl:   PAST MEDICAL HISTORY: Past Medical History:  Diagnosis Date  . Headache   . Hypertension   . Stroke (HCC)   . Vision abnormalities     PAST SURGICAL HISTORY: Past Surgical History:  Procedure Laterality Date  . CHOLECYSTECTOMY    . PARTIAL HYSTERECTOMY      FAMILY HISTORY: Family History  Problem Relation Age of Onset  . Heart disease Mother   . Hypertension Father   . Heart disease Father     SOCIAL HISTORY:  Social History   Social History  . Marital status: Single    Spouse name: N/A  . Number of children: N/A  . Years of education: N/A   Occupational History  . Not on file.   Social History Main Topics  . Smoking status: Never Smoker  . Smokeless tobacco: Not on file  . Alcohol use No  . Drug use: No  . Sexual activity: Not on file   Other Topics Concern  . Not on file   Social History Narrative  . No narrative on file     PHYSICAL EXAM  Vitals:   05/16/16 1048  BP: (!) 158/82  Pulse: 84  Resp: 18  Weight: 240 lb (108.9 kg)  Height: 5' (1.524 m)    Body mass index is 46.87 kg/m.   General: The patient is well-developed and well-nourished and in no acute distress  Neck: The neck is moderately tender on the right  Neurologic Exam  Mental status: The patient is alert and oriented x 3 at the time of the examination. The patient has apparent normal  recent and remote memory, with an apparently normal attention span and concentration ability.   Speech is normal.  Cranial nerves: Extraocular movements are full.   There is good facial sensation to soft touch bilaterally. Facial strength is normal.  Trapezius and sternocleidomastoid strength is normal. No dysarthria is noted.  The tongue is midline, and the patient has symmetric elevation of the soft palate. No obvious hearing deficits are noted.  Motor:  Muscle bulk is normal.   Tone is normal. Strength is  5 / 5 in all 4 extremities.   Sensory: Sensory testing is intact to touch and vibration sensation in all 4 extremities.  Coordination: Cerebellar testing reveals good finger-nose-finger bilaterally.  Gait and station: Station is normal.   Gait is is minimally wide. Tandem gait is wide. Romberg is negative.  Reflexes: Deep tendon reflexes are increased in legs bilaterally.  Marland Kitchen    DIAGNOSTIC DATA (LABS, IMAGING, TESTING) - I reviewed patient records, labs, notes, testing and imaging myself where available.     ASSESSMENT AND PLAN  Chronic migraine without aura with status migrainosus, not intractable  Anxiety state  OSA on CPAP  Memory loss  Abnormal brain MRI   1.   Botox190 Units: Forehead 5 units 6, lower forehead 5 units 3, left capitis muscle 10 units, right capitis muscle 15 units, temporalis and a separate talus 5 units 6 on the right and 5 units 5 on the left and right trapezius 20 units, right scalene 10 units, left scalenes 5 units, left trapezius 10 units, C6-C7 paraspinal units 10 units 2, 10 units wasted. 2.   Continue Keppra and cyclobenzaprine for now to help pain / sleep 3.   Continue CPAP 4.   She will return to see me in 3 months or sooner if she has new or worsening neurologic symptoms. If she has more  memory decline consider MRI around time of next visit    Tempest Frankland A. Epimenio Foot, MD, PhD 05/16/2016, 5:17 PM Certified in Neurology, Clinical  Neurophysiology, Sleep Medicine, Pain Medicine and Neuroimaging  [facility] [address] [place], [place] [address] [phone]

## 2016-08-15 ENCOUNTER — Ambulatory Visit (INDEPENDENT_AMBULATORY_CARE_PROVIDER_SITE_OTHER): Payer: Medicare Other | Admitting: Neurology

## 2016-08-15 ENCOUNTER — Encounter: Payer: Self-pay | Admitting: Neurology

## 2016-08-15 DIAGNOSIS — F411 Generalized anxiety disorder: Secondary | ICD-10-CM | POA: Diagnosis not present

## 2016-08-15 DIAGNOSIS — G43701 Chronic migraine without aura, not intractable, with status migrainosus: Secondary | ICD-10-CM

## 2016-08-15 DIAGNOSIS — R413 Other amnesia: Secondary | ICD-10-CM

## 2016-08-15 DIAGNOSIS — M542 Cervicalgia: Secondary | ICD-10-CM | POA: Diagnosis not present

## 2016-08-15 DIAGNOSIS — G4733 Obstructive sleep apnea (adult) (pediatric): Secondary | ICD-10-CM | POA: Diagnosis not present

## 2016-08-15 DIAGNOSIS — Z9989 Dependence on other enabling machines and devices: Secondary | ICD-10-CM | POA: Diagnosis not present

## 2016-08-15 MED ORDER — ESCITALOPRAM OXALATE 20 MG PO TABS: 20.0000 mg | ORAL_TABLET | Freq: Every day | ORAL | 11 refills | Status: DC

## 2016-08-15 NOTE — Progress Notes (Signed)
[facility]  PATIENT: Jeanne White DOB: Oct 01, 1950  REFERRING DOCTOR OR PCP:  Brooke BonitoWarren Gallemore SOURCE: Patient and records from [facility]  _________________________________   HISTORICAL  CHIEF COMPLAINT:  Chief Complaint  Patient presents with  . Migraines    HISTORY OF PRESENT ILLNESS:  Jeanne White is a 65 yo woman with chronic migraine headaches.   She also has memory loss and a very abnormal MRI of the brain.    She had her first Botox 3 months ago and is doing better .    HA frequency is better --- now 15/30 days instead of 30.   Intensity on days she has migraines is less.      Migraines:    Headaches are usually worse on the right than the left  She wakes up with pain most days.   She has had headaches X 40-50 years, worse over the past 10 years, right more than left.  Vision is sometimes blurry before headache and sometimes becomes blurry when pain is worse.   Moving her head or looking down will worsen the pain. She often gets nausea but rarely has vomiting.   Keppra has helped a little bit.   Heat and wind  Triggers a migraine or intensifies existing pain.    Imitrex and other triptans have not helped in the past.   Excedrin migraine sometimes helps a short time.   An occipital nerve block/splenius capitis muscle injection helped the pain for a week or 2.  Medications tried and failed for chronic Migraine:    Antiepileptics (Topamax, Keppra).  Due to obesity can not go on Depakote. Beta blockers (metoprolol); Calcium channel blockers (Norvasc) Antidepressants (Lexapro, others in past) NSAIDs - ibuprofen, naproxen, Excedrin Migraine Triptans - Imitrex, Relpax  Memory:    She feels slightly better and she focuses better with less headache pain.   She stopped working in 2014 due to memory difficulties.    She also notes decreased focus and attention.  Mood:  She notes moderate anxiety with panic attacks.   Only mild depression.  She  feels stressed out easily.  She gets upset and frustrated easily.    Lexapro has helped mood  OSA:   She also has OSA treated with CPAP.  She is very compliant and uses it nightly. She sleeps much better when she wears it than when she does not.  Abnormal MRI History:   In 2007, she was diagnosed with Bells palsy.   She had an MRI showing severe white matter changes and was referred to Dr. Leotis ShamesJeffery who diagnosed her with MS and started Copaxone.   She felt worse and began to see me in 2009 or so.   In the past, I reviewed her MRI images from 04/02/2014. There is cortical and cerebellar atrophy and extensive white matter hyperintensity. There is some progression compared to the MRI of the brain dated 08/24/2010.  Notch 3 (CADASIL) and COL4A1 testing were negative.  Her father has hereditary hemorrhagic telangiectasia   REVIEW OF SYSTEMS: Constitutional: No fevers, chills, sweats, or change in appetite Eyes: No visual changes, double vision, eye pain Ear, nose and throat: No hearing loss, ear pain, nasal congestion, sore throat Cardiovascular: No chest pain, palpitations Respiratory: No shortness of breath at rest or with exertion.   No wheezes GastrointestinaI: No nausea, vomiting, diarrhea, abdominal pain, fecal incontinence Genitourinary: No dysuria, urinary retention or frequency.  No nocturia. Musculoskeletal: No neck pain, back pain Integumentary: No rash, pruritus, skin lesions  Neurological: as above Psychiatric: No depression at this time.  No anxiety Endocrine: No palpitations, diaphoresis, change in appetite, change in weigh or increased thirst Hematologic/Lymphatic: No anemia, purpura, petechiae. Allergic/Immunologic: No itchy/runny eyes, nasal congestion, recent allergic reactions, rashes  ALLERGIES: Allergies  Allergen Reactions  . Ciprofloxacin Other (See Comments)    Headache  . Mometasone Other (See Comments)    Unknown    HOME MEDICATIONS:  Current Outpatient  Prescriptions:  .  amLODipine (NORVASC) 5 MG tablet, TK 1 T PO  D FOR BLOOD PRESSURE, Disp: , Rfl: 11 .  cyclobenzaprine (FLEXERIL) 5 MG tablet, Take 1 tablet (5 mg total) by mouth at bedtime., Disp: 30 tablet, Rfl: 5 .  escitalopram (LEXAPRO) 20 MG tablet, Take 20 mg by mouth daily., Disp: , Rfl:  .  hydrochlorothiazide (HYDRODIURIL) 25 MG tablet, TK 1 T PO  D, Disp: , Rfl: 5 .  levothyroxine (SYNTHROID, LEVOTHROID) 150 MCG tablet, Take 150 mcg by mouth daily before breakfast., Disp: , Rfl:  .  losartan (COZAAR) 25 MG tablet, TK 1 T PO  QD, Disp: , Rfl: 0 .  metoprolol succinate (TOPROL-XL) 100 MG 24 hr tablet, Take 100 mg by mouth daily. Take with or immediately following a meal., Disp: , Rfl:   PAST MEDICAL HISTORY: Past Medical History:  Diagnosis Date  . Headache   . Hypertension   . Stroke (HCC)   . Vision abnormalities     PAST SURGICAL HISTORY: Past Surgical History:  Procedure Laterality Date  . CHOLECYSTECTOMY    . PARTIAL HYSTERECTOMY      FAMILY HISTORY: Family History  Problem Relation Age of Onset  . Heart disease Mother   . Hypertension Father   . Heart disease Father     SOCIAL HISTORY:  Social History   Social History  . Marital status: Single    Spouse name: N/A  . Number of children: N/A  . Years of education: N/A   Occupational History  . Not on file.   Social History Main Topics  . Smoking status: Never Smoker  . Smokeless tobacco: Not on file  . Alcohol use No  . Drug use: No  . Sexual activity: Not on file   Other Topics Concern  . Not on file   Social History Narrative  . No narrative on file     PHYSICAL EXAM  There were no vitals filed for this visit.  There is no height or weight on file to calculate BMI.   General: The patient is well-developed and well-nourished and in no acute distress  Neck: The neck is mildly tender on the right  Neurologic Exam  Mental status: The patient is alert and oriented x 3 at the time  of the examination. The patient has apparent normal recent and remote memory, with mildly reduced attention span and concentration ability.   Speech is normal.  Cranial nerves: Extraocular movements are full.   There is good facial sensation to soft touch bilaterally. Facial strength is normal.  Trapezius and sternocleidomastoid strength is normal. No dysarthria is noted.  The tongue is midline, and the patient has symmetric elevation of the soft palate. No obvious hearing deficits are noted.  Motor:  Muscle bulk is normal.   Tone is normal. Strength is  5 / 5 in all 4 extremities.   Sensory: Sensory testing is intact to touch and vibration sensation in all 4 extremities.  Coordination: Cerebellar testing reveals good finger-nose-finger bilaterally.  Gait and station: Station is  normal.   Gait is is minimally wide. Tandem gait is wide. Romberg is negative.   Reflexes: Deep tendon reflexes are increased in legs bilaterally.  Marland Kitchen.    DIAGNOSTIC DATA (LABS, IMAGING, TESTING) - I reviewed patient records, labs, notes, testing and imaging myself where available.     ASSESSMENT AND PLAN  Chronic migraine without aura with status migrainosus, not intractable  OSA on CPAP  Anxiety state  Neck pain  Memory loss   1.   Botox190 Units: Forehead 5 units 6, lower forehead 5 units 3, left capitis muscle 10 units, right capitis muscle 15 units, temporalis and a separate talus 5 units 6 on the right and 5 units 5 on the left and right trapezius 20 units, right scalene 10 units, left scalenes 5 units, left trapezius 10 units, C6-C7 paraspinal units 10 units 2, 10 units wasted. 2.   Lexapro for anxiety.   Continue Keppra and cyclobenzaprine for now to help pain / sleep 3.   Continue CPAP 4.   She will return to see me in 3 months or sooner if she has new or worsening neurologic symptoms.       Richard A. Epimenio FootSater, MD, PhD 08/15/2016, 3:05 PM Certified in Neurology, Clinical Neurophysiology,  Sleep Medicine, Pain Medicine and Neuroimaging  [facility] [address], [address] [place], [place] [address] [phone]

## 2016-11-21 ENCOUNTER — Ambulatory Visit: Payer: Medicare Other | Admitting: Neurology

## 2016-11-27 ENCOUNTER — Encounter: Payer: Self-pay | Admitting: Neurology

## 2016-12-19 ENCOUNTER — Ambulatory Visit (INDEPENDENT_AMBULATORY_CARE_PROVIDER_SITE_OTHER): Payer: Medicare Other | Admitting: Neurology

## 2016-12-19 ENCOUNTER — Encounter: Payer: Self-pay | Admitting: Neurology

## 2016-12-19 VITALS — BP 150/78 | HR 63 | Resp 20 | Ht 60.0 in | Wt 232.0 lb

## 2016-12-19 DIAGNOSIS — G43701 Chronic migraine without aura, not intractable, with status migrainosus: Secondary | ICD-10-CM

## 2016-12-19 DIAGNOSIS — Z9989 Dependence on other enabling machines and devices: Secondary | ICD-10-CM | POA: Diagnosis not present

## 2016-12-19 DIAGNOSIS — R413 Other amnesia: Secondary | ICD-10-CM

## 2016-12-19 DIAGNOSIS — G4733 Obstructive sleep apnea (adult) (pediatric): Secondary | ICD-10-CM

## 2016-12-19 DIAGNOSIS — R9089 Other abnormal findings on diagnostic imaging of central nervous system: Secondary | ICD-10-CM

## 2016-12-19 DIAGNOSIS — R42 Dizziness and giddiness: Secondary | ICD-10-CM

## 2016-12-19 MED ORDER — IMIPRAMINE HCL 25 MG PO TABS: 25.0000 mg | ORAL_TABLET | Freq: Every day | ORAL | 11 refills | Status: DC

## 2016-12-19 MED ORDER — PREDNISONE 10 MG PO TABS: 10.0000 mg | ORAL_TABLET | Freq: Every day | ORAL | 0 refills | Status: DC

## 2016-12-19 NOTE — Patient Instructions (Signed)
Consider riboflavin and magnesium 200 mg of each twice a day (400 mg of each daily).

## 2016-12-19 NOTE — Progress Notes (Signed)
[facility]  PATIENT: Jeanne White DOB: [birth year]  REFERRING DOCTOR OR PCP:  Brooke Bonito SOURCE: Patient and records from [facility]  _________________________________   HISTORICAL  CHIEF COMPLAINT:  Chief Complaint  Patient presents with  . Headaches    Sts. h/a's continue to be daily.  Sts. she is compliant with CPAP /fim  . Memory Loss  . Sleep Apnea    HISTORY OF PRESENT ILLNESS:  Jeanne White is a 66 yo woman with chronic migraine headaches, memory loss and a very abnormal MRI of the brain.  She felt better after her first Botox in 2017 but the second one did not seem to help any.    HA's are occurring practically every day.    The intensity is severe at times.  Headaches occur on the right more than the left.    Standing up is triggering mor eHA  She also notes that she is feeling more off balanced and more dizzy.  She gets spells of vertigo x 1-2 minutes often occurring with movements like bending over or standing up.    The spells just started one day.     Migraines:    Headaches are usually worse on the right than the left  She wakes up with pain most days.   She has had headaches X 40-50 years, worse over the past 10 years, right more than left.  Vision is sometimes blurry before headache and sometimes becomes blurry when pain is worse.   Moving her head or looking down will worsen the pain. She often gets nausea but rarely has vomiting.   Keppra has helped a little bit.   Heat and wind  Triggers a migraine or intensifies existing pain.    Imitrex and other triptans have not helped in the past.   Excedrin migraine sometimes helps a short time.   An occipital nerve block/splenius capitis muscle injection helped the pain for a week or 2.  Medications tried and failed for chronic Migraine:    Antiepileptics (Topamax, Keppra).  Due to obesity can not go on Depakote. Beta blockers (metoprolol); Calcium channel blockers  (Norvasc) Antidepressants (Lexapro, others in past) NSAIDs - ibuprofen, naproxen, Excedrin Migraine Triptans - Imitrex, Relpax  Memory:    She has some difficulty with focus and attention. She stopped working in 2014 due to memory difficulties. Her sister notes that symptoms have worsened further   Mood:  She has moderate anxiety and panic attacks.   Only mild depression.  She feels stressed out easily.  She gets upset and frustrated easily.    Lexapro has helped mood  OSA:   She also has OSA treated with CPAP.  She is very compliant and uses it nightly. She sleeps much better when she wears it than when she does not.  Unfortunately, treating the sleep apnea did not help the headaches any nor memory difficulties.  Abnormal MRI History:   In 2007, she was diagnosed with Bells palsy.   She had an MRI showing severe white matter changes and was referred to Dr. Leotis Shames who diagnosed her with MS and started Copaxone.   She felt worse and began to see me in 2009 or so.   In the past, I reviewed her MRI images from 04/02/2014. There is cortical and cerebellar atrophy and extensive white matter hyperintensity. There is some progression compared to the MRI of the brain dated 08/24/2010.  Notch 3 (CADASIL) and COL4A1 testing were negative.  Her father has hereditary hemorrhagic  telangiectasia   REVIEW OF SYSTEMS: Constitutional: No fevers, chills, sweats, or change in appetite Eyes: No visual changes, double vision, eye pain Ear, nose and throat: No hearing loss, ear pain, nasal congestion, sore throat Cardiovascular: No chest pain, palpitations Respiratory: No shortness of breath at rest or with exertion.   No wheezes GastrointestinaI: No nausea, vomiting, diarrhea, abdominal pain, fecal incontinence Genitourinary: No dysuria, urinary retention or frequency.  No nocturia. Musculoskeletal: No neck pain, back pain Integumentary: No rash, pruritus, skin lesions Neurological: as above Psychiatric: No  depression at this time.  No anxiety Endocrine: No palpitations, diaphoresis, change in appetite, change in weigh or increased thirst Hematologic/Lymphatic: No anemia, purpura, petechiae. Allergic/Immunologic: No itchy/runny eyes, nasal congestion, recent allergic reactions, rashes  ALLERGIES: Allergies  Allergen Reactions  . Ciprofloxacin Other (See Comments)    Headache  . Mometasone Other (See Comments)    Unknown    HOME MEDICATIONS:  Current Outpatient Prescriptions:  .  amLODipine (NORVASC) 5 MG tablet, TK 1 T PO  D FOR BLOOD PRESSURE, Disp: , Rfl: 11 .  cyclobenzaprine (FLEXERIL) 5 MG tablet, Take 1 tablet (5 mg total) by mouth at bedtime., Disp: 30 tablet, Rfl: 5 .  escitalopram (LEXAPRO) 20 MG tablet, Take 1 tablet (20 mg total) by mouth daily., Disp: 30 tablet, Rfl: 11 .  hydrochlorothiazide (HYDRODIURIL) 25 MG tablet, TK 1 T PO  D, Disp: , Rfl: 5 .  levothyroxine (SYNTHROID, LEVOTHROID) 150 MCG tablet, Take 150 mcg by mouth daily before breakfast., Disp: , Rfl:  .  losartan (COZAAR) 25 MG tablet, TK 1 T PO  QD, Disp: , Rfl: 0 .  metoprolol succinate (TOPROL-XL) 100 MG 24 hr tablet, Take 100 mg by mouth daily. Take with or immediately following a meal., Disp: , Rfl:  .  imipramine (TOFRANIL) 25 MG tablet, Take 1 tablet (25 mg total) by mouth at bedtime., Disp: 30 tablet, Rfl: 11 .  predniSONE (DELTASONE) 10 MG tablet, Take 1 tablet (10 mg total) by mouth daily with breakfast. Take over 6 days, starting with 6 pills the first day, Disp: 21 tablet, Rfl: 0  PAST MEDICAL HISTORY: Past Medical History:  Diagnosis Date  . Headache   . Hypertension   . Stroke (HCC)   . Vision abnormalities     PAST SURGICAL HISTORY: Past Surgical History:  Procedure Laterality Date  . CHOLECYSTECTOMY    . PARTIAL HYSTERECTOMY      FAMILY HISTORY: Family History  Problem Relation Age of Onset  . Heart disease Mother   . Hypertension Father   . Heart disease Father     SOCIAL  HISTORY:  Social History   Social History  . Marital status: Single    Spouse name: N/A  . Number of children: N/A  . Years of education: N/A   Occupational History  . Not on file.   Social History Main Topics  . Smoking status: Never Smoker  . Smokeless tobacco: Never Used  . Alcohol use No  . Drug use: No  . Sexual activity: Not on file   Other Topics Concern  . Not on file   Social History Narrative  . No narrative on file     PHYSICAL EXAM  Vitals:   12/19/16 1307  BP: (!) 150/78  Pulse: 63  Resp: 20  Weight: 232 lb (105.2 kg)  Height: 5' (1.524 m)    Body mass index is 45.31 kg/m.   General: The patient is well-developed and well-nourished and in no  acute distress  Neck: The neck isnontender today  Neurologic Exam  Mental status: The patient is alert and oriented x 3 at the time of the examination. The patient has apparent normal recent and remote memory, with mildly reduced attention span and concentration ability.   Speech is normal.  Cranial nerves: Extraocular movements are full.   There is good facial sensation to soft touch bilaterally. Facial strength is normal.  Trapezius and sternocleidomastoid strength is normal. No dysarthria is noted.  The tongue is midline, and the patient has symmetric elevation of the soft palate. No obvious hearing deficits are noted.  Motor:  Muscle bulk is normal.   Tone is normal. Strength is  5 / 5 in all 4 extremities.   Sensory: Sensory testing is intact to touch and vibration sensation in all 4 extremities.  Coordination: Cerebellar testing reveals good finger-nose-finger bilaterally.   Heel-to-shin is mildly reduced.  Gait and station: Station is normal.   Gait is is minimally wide. Tandem gait is wide. Romberg is negative.   Reflexes: Deep tendon reflexes are increased in legs bilaterally.  .  Dix-Hallpike maneuver did not evoke vertigo or nystagmus    DIAGNOSTIC DATA (LABS, IMAGING, TESTING) - I  reviewed patient records, labs, notes, testing and imaging myself where available.     ASSESSMENT AND PLAN  Vertigo - Plan: MR BRAIN W WO CONTRAST  Chronic migraine without aura with status migrainosus, not intractable  OSA on CPAP  Memory loss  Abnormal brain MRI   1.   She did not respond well to her second course of Botox and opts not to receive additional Botox at this time. I will have her try imipramine 25 mg nightly to see if she gets any benefit from that.   We also discussed that anti-CGRP compounds may be available within a few months and this may also help her chronic migraines.    2.   Due to her worsening headaches and vertigo, we need to check an MRI of the brain to make sure that there is not a mass lesion, stroke or other issue.    Advised to do [employer] exercises. 3.   Continue Lexapro for anxiety.   Continue cyclobenzaprine for now to help pain / sleep 4.   Continue CPAP 5.   She will return to see me in 3 months or sooner if she has new or worsening neurologic symptoms.    40 minutes face-to-face evaluation with greater than one half of the time counseling and coordinating care about her new neurologic issues of vertigo and chronic issues of HA and memory decline.    Craige Patel A. Epimenio Foot, MD, PhD 12/19/2016, 7:14 PM Certified in Neurology, Clinical Neurophysiology, Sleep Medicine, Pain Medicine and Neuroimaging  [facility] [address], [address] [place], [place] [address] [phone]  ;

## 2017-01-01 ENCOUNTER — Ambulatory Visit
Admission: RE | Admit: 2017-01-01 | Discharge: 2017-01-01 | Disposition: A | Discharge status: Home / Self Care | Payer: Medicare Other | Source: Ambulatory Visit | Attending: Neurology | Admitting: Neurology

## 2017-01-01 DIAGNOSIS — R42 Dizziness and giddiness: Secondary | ICD-10-CM

## 2017-01-01 MED ORDER — GADOBENATE DIMEGLUMINE 529 MG/ML IV SOLN
20.0000 mL | Freq: Once | INTRAVENOUS | Status: AC | PRN
  Administered 2017-01-01: 20 mL via INTRAVENOUS

## 2017-01-07 ENCOUNTER — Telehealth: Payer: Self-pay | Admitting: *Deleted

## 2017-01-07 NOTE — Telephone Encounter (Signed)
I have spoken with Marcelino Duster at [facility] and requested MRI brain be pushed to PACS so RAS can view images/fim

## 2017-01-07 NOTE — Telephone Encounter (Signed)
-----   Message from Asa Lente, MD sent at 01/04/2017  3:53 PM EDT ----- Please see if you can have [facility] put her last brain MRI onto PACs and I will compare the MRI side-by-side when I return.   Please send this task back to me to remind me.   Thank you.

## 2017-01-10 ENCOUNTER — Telehealth: Payer: Self-pay | Admitting: Neurology

## 2017-01-10 NOTE — Telephone Encounter (Signed)
Patient called office requesting mri results.  Please call

## 2017-01-14 NOTE — Telephone Encounter (Signed)
I have spoken with Jeanne White this afternoon and reviewed MRI brain results with her/fim

## 2017-01-14 NOTE — Telephone Encounter (Signed)
Patient called office requesting MRI results.  Please call °

## 2017-01-21 ENCOUNTER — Telehealth: Payer: Self-pay | Admitting: Neurology

## 2017-01-21 NOTE — Telephone Encounter (Signed)
I personally compared her MRI of the brain dated 01/01/2017 and compared it to the one from [facility] from 04/02/2014.     Both show severe confluent T2/flair hyperintense changes in the hemispheres with milder changes in the pons. Both also show multiple microhemorrhages. There are a couple more microhemorrhages noted on the current study though different technique was used. There also may be slight progression of the moderate atrophy.  There were no acute findings

## 2017-04-25 ENCOUNTER — Encounter: Payer: Self-pay | Admitting: Neurology

## 2017-04-25 ENCOUNTER — Ambulatory Visit (INDEPENDENT_AMBULATORY_CARE_PROVIDER_SITE_OTHER): Payer: Medicare Other | Admitting: Neurology

## 2017-04-25 VITALS — BP 143/81 | HR 61 | Wt 229.5 lb

## 2017-04-25 DIAGNOSIS — F4323 Adjustment disorder with mixed anxiety and depressed mood: Secondary | ICD-10-CM | POA: Diagnosis not present

## 2017-04-25 DIAGNOSIS — R9082 White matter disease, unspecified: Secondary | ICD-10-CM | POA: Diagnosis not present

## 2017-04-25 DIAGNOSIS — R9089 Other abnormal findings on diagnostic imaging of central nervous system: Secondary | ICD-10-CM

## 2017-04-25 DIAGNOSIS — Z9989 Dependence on other enabling machines and devices: Secondary | ICD-10-CM | POA: Diagnosis not present

## 2017-04-25 DIAGNOSIS — R413 Other amnesia: Secondary | ICD-10-CM | POA: Diagnosis not present

## 2017-04-25 DIAGNOSIS — G43701 Chronic migraine without aura, not intractable, with status migrainosus: Secondary | ICD-10-CM | POA: Diagnosis not present

## 2017-04-25 DIAGNOSIS — G4733 Obstructive sleep apnea (adult) (pediatric): Secondary | ICD-10-CM

## 2017-04-25 MED ORDER — INDOMETHACIN 25 MG PO CAPS: 25.0000 mg | ORAL_CAPSULE | Freq: Three times a day (TID) | ORAL | 3 refills | Status: DC | PRN

## 2017-04-25 NOTE — Progress Notes (Signed)
[facility]  PATIENT: Jeanne White DOB: May 19, 1951  REFERRING DOCTOR OR PCP:  Brooke Bonito SOURCE: Patient and records from [facility]  _________________________________   HISTORICAL  CHIEF COMPLAINT:  Chief Complaint  Patient presents with  . Follow-up    Headache, dizzy spells     HISTORY OF PRESENT ILLNESS:  Jeanne White is a 66 yo woman with chronic migraine headaches, memory loss and a very abnormal MRI of the brain.    Etiology of her severe white matter changes is unclear and lab work has not helped to find her diagnosis.   Her main problems are headaches and memory loss. She felt better after her first Botox in 2017 but the second one did not seem to help any.    HA's are occurring practically every day.  The worst pain is in the forehead region. She also has occipital pain.   The intensity is severe at times.    Standing up is triggering more HA  Dizziness is doing better.   If she stands up slow and takes 20-30 seconds before walking there is less lightheadedness.   She still gets dizzy if she turns around fast or bends over and gets back up.     Migraines:    Headaches are usually worse on the right than the left  She wakes up with pain most days.   She has had headaches X 40-50 years, worse over the past 10 years, right more than left.  Vision is sometimes blurry before headache and sometimes becomes blurry when pain is worse.   Moving her head or looking down will worsen the pain. She often gets nausea but rarely has vomiting.   Keppra has helped a little bit.   Heat and wind  Triggers a migraine or intensifies existing pain.    Imitrex and other triptans have not helped in the past.   Excedrin migraine sometimes helps a short time.   An occipital nerve block/splenius capitis muscle injection helped the pain for a week or 2.    Botox did not help the pain much. She has not tried an anti-CGRP agent yet  Medications tried and failed for  chronic Migraine:    Antiepileptics (Topamax, Keppra).  Due to obesity can not go on Depakote. Beta blockers (metoprolol); Calcium channel blockers (Norvasc) Antidepressants (Lexapro, others in past) NSAIDs - ibuprofen, naproxen, Excedrin Migraine Triptans - Imitrex, Relpax  Memory:    She continues to note difficulty with focus and attention. She was having difficulty at work about 5 years ago and stopped working in 2014. She is close to her sister who notes that Jeanne White has had slow worsening over time.   Mood:  She has moderate anxiety and panic attacks.   Only mild depression.  She feels stressed out easily.  She gets upset and frustrated easily.    Lexapro has helped mood  OSA:   She also has OSA treated with CPAP.  She is very compliant and uses it nightly. She sleeps much better when she wears it than when she does not.  Unfortunately, treating the sleep apnea did not help the headaches any nor memory difficulties.  Abnormal MRI History:   In 2007, she was diagnosed with Bells palsy.   She had an MRI showing severe white matter changes and was referred to Dr. Leotis Shames who diagnosed her with MS and started Copaxone.   She felt worse and began to see me in 2009 or so.   In the  past, I reviewed her MRI images from 04/02/2014. There is cortical and cerebellar atrophy and extensive white matter hyperintensity. There is some progression compared to the MRI of the brain dated 08/24/2010.  Notch 3 (CADASIL) and COL4A1 testing were negative.  Her father has hereditary hemorrhagic telangiectasia   REVIEW OF SYSTEMS: Constitutional: No fevers, chills, sweats, or change in appetite Eyes: No visual changes, double vision, eye pain Ear, nose and throat: No hearing loss, ear pain, nasal congestion, sore throat Cardiovascular: No chest pain, palpitations Respiratory: No shortness of breath at rest or with exertion.   No wheezes GastrointestinaI: No nausea, vomiting, diarrhea, abdominal pain, fecal  incontinence Genitourinary: No dysuria, urinary retention or frequency.  No nocturia. Musculoskeletal: No neck pain, back pain Integumentary: No rash, pruritus, skin lesions Neurological: as above Psychiatric: No depression at this time.  No anxiety Endocrine: No palpitations, diaphoresis, change in appetite, change in weigh or increased thirst Hematologic/Lymphatic: No anemia, purpura, petechiae. Allergic/Immunologic: No itchy/runny eyes, nasal congestion, recent allergic reactions, rashes  ALLERGIES: Allergies  Allergen Reactions  . Ciprofloxacin Other (See Comments)    Headache  . Mometasone Other (See Comments)    Unknown    HOME MEDICATIONS:  Current Outpatient Prescriptions:  .  amLODipine (NORVASC) 5 MG tablet, TK 1 T PO  D FOR BLOOD PRESSURE, Disp: , Rfl: 11 .  cyclobenzaprine (FLEXERIL) 5 MG tablet, Take 1 tablet (5 mg total) by mouth at bedtime., Disp: 30 tablet, Rfl: 5 .  escitalopram (LEXAPRO) 20 MG tablet, Take 1 tablet (20 mg total) by mouth daily., Disp: 30 tablet, Rfl: 11 .  hydrochlorothiazide (HYDRODIURIL) 25 MG tablet, TK 1 T PO  D, Disp: , Rfl: 5 .  imipramine (TOFRANIL) 25 MG tablet, Take 1 tablet (25 mg total) by mouth at bedtime., Disp: 30 tablet, Rfl: 11 .  levothyroxine (SYNTHROID, LEVOTHROID) 150 MCG tablet, Take 150 mcg by mouth daily before breakfast., Disp: , Rfl:  .  losartan (COZAAR) 25 MG tablet, TK 1 T PO  QD, Disp: , Rfl: 0 .  metoprolol succinate (TOPROL-XL) 100 MG 24 hr tablet, Take 100 mg by mouth daily. Take with or immediately following a meal., Disp: , Rfl:  .  predniSONE (DELTASONE) 10 MG tablet, Take 1 tablet (10 mg total) by mouth daily with breakfast. Take over 6 days, starting with 6 pills the first day, Disp: 21 tablet, Rfl: 0 .  indomethacin (INDOCIN) 25 MG capsule, Take 1 capsule (25 mg total) by mouth 3 (three) times daily as needed. Take with food, Disp: 90 capsule, Rfl: 3  PAST MEDICAL HISTORY: Past Medical History:  Diagnosis  Date  . Headache   . Hypertension   . Stroke (HCC)   . Vision abnormalities     PAST SURGICAL HISTORY: Past Surgical History:  Procedure Laterality Date  . CHOLECYSTECTOMY    . PARTIAL HYSTERECTOMY      FAMILY HISTORY: Family History  Problem Relation Age of Onset  . Heart disease Mother   . Hypertension Father   . Heart disease Father     SOCIAL HISTORY:  Social History   Social History  . Marital status: Single    Spouse name: N/A  . Number of children: N/A  . Years of education: N/A   Occupational History  . Not on file.   Social History Main Topics  . Smoking status: Never Smoker  . Smokeless tobacco: Never Used  . Alcohol use No  . Drug use: No  . Sexual activity: Not on  file   Other Topics Concern  . Not on file   Social History Narrative  . No narrative on file     PHYSICAL EXAM  Vitals:   04/25/17 1138  BP: (!) 143/81  Pulse: 61  Weight: 229 lb 8 oz (104.1 kg)    Body mass index is 44.82 kg/m.   General: The patient is well-developed and well-nourished and in no acute distress  Neck: The neck isnontender today  Neurologic Exam  Mental status: The patient is alert and oriented x 3 at the time of the examination. The patient has apparent normal recent and remote memory, with mildly reduced attention span and concentration ability.   Speech is normal.  Cranial nerves: Extraocular movements are full.   There is good facial sensation to soft touch bilaterally. Facial strength is normal.  Trapezius and sternocleidomastoid strength is normal. No dysarthria is noted.  The tongue is midline, and the patient has symmetric elevation of the soft palate. No obvious hearing deficits are noted.  Motor:  Muscle bulk is normal.   Tone is normal. Strength is  5 / 5 in all 4 extremities.   Sensory: Sensory testing is intact to touch and vibration sensation in all 4 extremities.  Coordination: Cerebellar testing reveals good finger-nose-finger  bilaterally.   Heel-to-shin is mildly reduced.  Gait and station: Station is normal.   Gait is is minimally wide. Tandem gait is wide. Romberg is negative.   Reflexes: Deep tendon reflexes are increased in legs bilaterally.  .  Dix-Hallpike maneuver did not evoke vertigo or nystagmus    DIAGNOSTIC DATA (LABS, IMAGING, TESTING) - I reviewed patient records, labs, notes, testing and imaging myself where available.     ASSESSMENT AND PLAN  Abnormal brain MRI  White matter disease, unspecified  Memory loss  Chronic migraine without aura with status migrainosus, not intractable  OSA on CPAP  Adjustment disorder with mixed anxiety and depressed mood    1.    We discussed the new anti-CGRP compound , Aimovig, to  help her chronic migraines.    She is interested and signed the service request form. 2.   Trial of indomethacin up to 3 times a day for migraines. 3.    Toradol 60 mg IM 4.    Continue Lexapro for anxiety.   Continue cyclobenzaprine for now to help pain / sleep 5.   Continue CPAP 6.   He will return to see me in 3 or 4 months or sooner if she has new or worsening neurologic symptoms.    Minutes face-to-face evaluation with greater than one half of the time counseling and coordinating care about her  chronic issues of HA and memory decline and options  Jhana Giarratano A. Epimenio FootSater, MD, PhD 04/25/2017, 12:54 PM Certified in Neurology, Clinical Neurophysiology, Sleep Medicine, Pain Medicine and Neuroimaging  [facility] [address], [address] [place], [place] [id number] [phone]

## 2017-05-02 ENCOUNTER — Telehealth: Payer: Self-pay | Admitting: Neurology

## 2017-05-02 NOTE — Telephone Encounter (Signed)
Pt called [facility] advised her insurance will not cover indomethacin (INDOCIN) 25 MG capsule . Pt is aware Dr Epimenio FootSater is out of the office until Monday. Please call to advise

## 2017-05-02 NOTE — Telephone Encounter (Signed)
I have spoken with Jeanne White this afternoon and explained that cash price for Indocin 25mg , #90 is around $12-16 with coupon from [email or web address].  I have walked her thru how to check the website for lowest price, call pharmacy to confirm price, download free coupon if needed.  She verbalized understanding of same/fim

## 2017-05-13 ENCOUNTER — Other Ambulatory Visit: Payer: Self-pay | Admitting: Neurology

## 2017-08-19 ENCOUNTER — Ambulatory Visit: Payer: Self-pay | Admitting: Neurology

## 2017-08-29 ENCOUNTER — Ambulatory Visit: Payer: Medicare Other | Admitting: Neurology

## 2017-10-29 ENCOUNTER — Other Ambulatory Visit: Payer: Self-pay

## 2017-10-29 ENCOUNTER — Encounter: Payer: Self-pay | Admitting: Neurology

## 2017-10-29 ENCOUNTER — Ambulatory Visit (INDEPENDENT_AMBULATORY_CARE_PROVIDER_SITE_OTHER): Payer: Medicare Other | Admitting: Neurology

## 2017-10-29 VITALS — BP 144/77 | HR 52 | Resp 24 | Ht 60.0 in | Wt 226.5 lb

## 2017-10-29 DIAGNOSIS — R9082 White matter disease, unspecified: Secondary | ICD-10-CM

## 2017-10-29 DIAGNOSIS — Z9989 Dependence on other enabling machines and devices: Secondary | ICD-10-CM

## 2017-10-29 DIAGNOSIS — G43701 Chronic migraine without aura, not intractable, with status migrainosus: Secondary | ICD-10-CM

## 2017-10-29 DIAGNOSIS — R413 Other amnesia: Secondary | ICD-10-CM | POA: Diagnosis not present

## 2017-10-29 DIAGNOSIS — G43001 Migraine without aura, not intractable, with status migrainosus: Secondary | ICD-10-CM

## 2017-10-29 DIAGNOSIS — G4733 Obstructive sleep apnea (adult) (pediatric): Secondary | ICD-10-CM

## 2017-10-29 DIAGNOSIS — F411 Generalized anxiety disorder: Secondary | ICD-10-CM

## 2017-10-29 MED ORDER — KETOROLAC TROMETHAMINE 60 MG/2ML IM SOLN
60.0000 mg | Freq: Once | INTRAMUSCULAR | Status: AC
  Administered 2017-10-29: 60 mg via INTRAMUSCULAR

## 2017-10-29 MED ORDER — FREMANEZUMAB-VFRM 225 MG/1.5ML ~~LOC~~ SOSY: 1.0000 | PREFILLED_SYRINGE | SUBCUTANEOUS | 4 refills | Status: DC

## 2017-10-29 NOTE — Progress Notes (Signed)
[facility]  PATIENT: Jeanne White DOB: July 14, 1951  REFERRING DOCTOR OR PCP:  Brooke Bonito SOURCE: Patient and records from [facility]  _________________________________   HISTORICAL  CHIEF COMPLAINT:  Chief Complaint  Patient presents with  . Sleep Apnea    Sts. h/a's are better with warmer weather, worse with rainy, cold weather.  Sts. is compliant with CPAP for OSA./fim  . Migraines  . Memory Loss  . Hx. of Abnormal MRI Brain    HISTORY OF PRESENT ILLNESS:  Jeanne White is a 67 yo woman with chronic migraine headaches, memory loss and a very abnormal MRI of the brain.      Update 10/29/2017: She is experiencing migraine HA's 20-24 days/month at least 4 hours/day.   Pain is usually right > left forehead.    When one occurs, she takes caffeine and OTC med's.   She did not get benefit form Keppra, topiramate, muscle relaxants, NSAIDs and Ca channel blockers and beta blockers have not helped.     Vision is doing about the same.  She continues to have some difficulty with focus, attention and short term memory.     kkm  She notes more dizziness when the HA is worse.    Medications tried and failed for chronic Migraine:    Antiepileptics (Topamax, Keppra).  Due to obesity can not go on Depakote. Beta blockers (metoprolol); Calcium channel blockers (Norvasc) Antidepressants (Lexapro, others in past) NSAIDs - ibuprofen, naproxen, Excedrin Migraine Triptans - Imitrex, Relpax   From 04/25/2017: Etiology of her severe white matter changes is unclear and lab work has not helped to find her diagnosis.   Her main problems are headaches and memory loss. She felt better after her first Botox in 2017 but the second one did not seem to help any.    HA's are occurring practically every day.  The worst pain is in the forehead region. She also has occipital pain.   The intensity is severe at times.    Standing up is triggering more HA  Dizziness is  doing better.   If she stands up slow and takes 20-30 seconds before walking there is less lightheadedness.   She still gets dizzy if she turns around fast or bends over and gets back up.   HA's usd to be daily but imipramine has helped some.     Migraines:    Headaches are usually worse on the right than the left  She wakes up with pain most days.   She has had headaches X 40-50 years, worse over the past 10 years, right more than left.  Vision is sometimes blurry before headache and sometimes becomes blurry when pain is worse.   Moving her head or looking down will worsen the pain. She often gets nausea but rarely has vomiting.   Keppra has helped a little bit.   Heat and wind  Triggers a migraine or intensifies existing pain.    Imitrex and other triptans have not helped in the past.   Excedrin migraine sometimes helps a short time.   An occipital nerve block/splenius capitis muscle injection helped the pain for a week or 2.    Botox did not help the pain much. She has not tried an anti-CGRP agent yet  Medications tried and failed for chronic Migraine:    Antiepileptics (Topamax, Keppra).  Due to obesity can not go on Depakote. Beta blockers (metoprolol); Calcium channel blockers (Norvasc) Antidepressants (Lexapro, others in past) NSAIDs - ibuprofen, naproxen,  Excedrin Migraine Triptans - Imitrex, Relpax  Memory:    She continues to note difficulty with focus and attention. She was having difficulty at work about 5 years ago and stopped working in 2014. She is close to her sister who notes that Jeanne White has had slow worsening over time.   Mood:  She has moderate anxiety and panic attacks.   Only mild depression.  She feels stressed out easily.  She gets upset and frustrated easily.    Lexapro has helped mood  OSA:   She also has OSA treated with CPAP.  She is very compliant and uses it nightly. She sleeps much better when she wears it than when she does not.  Unfortunately, treating the sleep apnea  did not help the headaches any nor memory difficulties.  Abnormal MRI History:   In 2007, she was diagnosed with Bells palsy.   She had an MRI showing severe white matter changes and was referred to Dr. Leotis ShamesJeffery who diagnosed her with MS and started Copaxone.   She felt worse and began to see me in 2009 or so.   In the past, I reviewed her MRI images from 04/02/2014. There is cortical and cerebellar atrophy and extensive white matter hyperintensity. There is some progression compared to the MRI of the brain dated 08/24/2010.  Notch 3 (CADASIL) and COL4A1 testing were negative.  Her father has hereditary hemorrhagic telangiectasia   REVIEW OF SYSTEMS: Constitutional: No fevers, chills, sweats, or change in appetite Eyes: No visual changes, double vision, eye pain Ear, nose and throat: No hearing loss, ear pain, nasal congestion, sore throat Cardiovascular: No chest pain, palpitations Respiratory: No shortness of breath at rest or with exertion.   No wheezes GastrointestinaI: No nausea, vomiting, diarrhea, abdominal pain, fecal incontinence Genitourinary: No dysuria, urinary retention or frequency.  No nocturia. Musculoskeletal: No neck pain, back pain Integumentary: No rash, pruritus, skin lesions Neurological: as above Psychiatric: No depression at this time.  No anxiety Endocrine: No palpitations, diaphoresis, change in appetite, change in weigh or increased thirst Hematologic/Lymphatic: No anemia, purpura, petechiae. Allergic/Immunologic: No itchy/runny eyes, nasal congestion, recent allergic reactions, rashes  ALLERGIES: Allergies  Allergen Reactions  . Ciprofloxacin Other (See Comments)    Headache  . Mometasone Other (See Comments)    Unknown    HOME MEDICATIONS:  Current Outpatient Medications:  .  amLODipine (NORVASC) 5 MG tablet, TK 1 T PO  D FOR BLOOD PRESSURE, Disp: , Rfl: 11 .  cyclobenzaprine (FLEXERIL) 5 MG tablet, TAKE 1 TABLET BY MOUTH AT BEDTIME, Disp: 30 tablet,  Rfl: 1 .  escitalopram (LEXAPRO) 20 MG tablet, Take 1 tablet (20 mg total) by mouth daily., Disp: 30 tablet, Rfl: 11 .  hydrochlorothiazide (HYDRODIURIL) 25 MG tablet, TK 1 T PO  D, Disp: , Rfl: 5 .  imipramine (TOFRANIL) 25 MG tablet, Take 1 tablet (25 mg total) by mouth at bedtime., Disp: 30 tablet, Rfl: 11 .  indomethacin (INDOCIN) 25 MG capsule, Take 1 capsule (25 mg total) by mouth 3 (three) times daily as needed. Take with food, Disp: 90 capsule, Rfl: 3 .  levothyroxine (SYNTHROID, LEVOTHROID) 150 MCG tablet, Take 150 mcg by mouth daily before breakfast., Disp: , Rfl:  .  losartan (COZAAR) 25 MG tablet, TK 1 T PO  QD, Disp: , Rfl: 0 .  metoprolol succinate (TOPROL-XL) 100 MG 24 hr tablet, Take 100 mg by mouth daily. Take with or immediately following a meal., Disp: , Rfl:  .  Fremanezumab-vfrm (AJOVY) 225  MG/1.5ML SOSY, Inject 1 Syringe into the skin every 28 (twenty-eight) days., Disp: 3 Syringe, Rfl: 4 .  predniSONE (DELTASONE) 10 MG tablet, Take 1 tablet (10 mg total) by mouth daily with breakfast. Take over 6 days, starting with 6 pills the first day, Disp: 21 tablet, Rfl: 0  PAST MEDICAL HISTORY: Past Medical History:  Diagnosis Date  . Headache   . Hypertension   . Stroke (HCC)   . Vision abnormalities     PAST SURGICAL HISTORY: Past Surgical History:  Procedure Laterality Date  . CHOLECYSTECTOMY    . PARTIAL HYSTERECTOMY      FAMILY HISTORY: Family History  Problem Relation Age of Onset  . Heart disease Mother   . Hypertension Father   . Heart disease Father     SOCIAL HISTORY:  Social History   Socioeconomic History  . Marital status: Single    Spouse name: Not on file  . Number of children: Not on file  . Years of education: Not on file  . Highest education level: Not on file  Social Needs  . Financial resource strain: Not on file  . Food insecurity - worry: Not on file  . Food insecurity - inability: Not on file  . Transportation needs - medical: Not  on file  . Transportation needs - non-medical: Not on file  Occupational History  . Not on file  Tobacco Use  . Smoking status: Never Smoker  . Smokeless tobacco: Never Used  Substance and Sexual Activity  . Alcohol use: No    Alcohol/week: 0.0 oz  . Drug use: No  . Sexual activity: Not on file  Other Topics Concern  . Not on file  Social History Narrative  . Not on file     PHYSICAL EXAM  Vitals:   10/29/17 1505  BP: (!) 144/77  Pulse: (!) 52  Resp: (!) 24  Weight: 226 lb 8 oz (102.7 kg)  Height: 5' (1.524 m)    Body mass index is 44.24 kg/m.   General: The patient is well-developed and well-nourished and in no acute distress  Neck: The neck isnontender today  Neurologic Exam  Mental status: The patient is alert and oriented x 3 at the time of the examination. The patient has apparent normal recent and remote memory, with mildly reduced attention span and concentration ability.   Speech is normal.  Cranial nerves: Extraocular movements are full.   There is good facial sensation to soft touch bilaterally. Facial strength is normal.  Trapezius and sternocleidomastoid strength is normal. No dysarthria is noted.  The tongue is midline, and the patient has symmetric elevation of the soft palate. No obvious hearing deficits are noted.  Motor:  Muscle bulk is normal.   Tone is normal. Strength is  5 / 5 in all 4 extremities.   Sensory: Sensory testing is intact to touch and vibration sensation in all 4 extremities.  Coordination: Cerebellar testing reveals good finger-nose-finger bilaterally.   Heel-to-shin is mildly reduced.  Gait and station: Station is normal.   Gait is is minimally wide. Tandem gait is wide. Romberg is negative.   Reflexes: Deep tendon reflexes are increased in legs bilaterally.  .  Dix-Hallpike maneuver did not evoke vertigo or nystagmus    DIAGNOSTIC DATA (LABS, IMAGING, TESTING) - I reviewed patient records, labs, notes, testing and imaging  myself where available.     ASSESSMENT AND PLAN  Migraine without aura and with status migrainosus, not intractable - Plan: ketorolac (TORADOL) injection  60 mg  Chronic migraine without aura with status migrainosus, not intractable  OSA on CPAP  White matter disease, unspecified  Anxiety state  Memory loss    1.    We discussed the new anti-CGRP compound and she would like to start Ajovy.   We'll let her know if this needs to be preauthorized.  2.   Toradol 60 mg IM 3.    Continue Lexapro for anxiety.   Continue cyclobenzaprine for now to help pain / sleep 4.   Continue CPAP 5.   Shewill return to see me in 6 months or sooner if she has new or worsening neurologic symptoms.     Dalary Hollar A. Epimenio Foot, MD, PhD 10/29/2017, 5:05 PM Certified in Neurology, Clinical Neurophysiology, Sleep Medicine, Pain Medicine and Neuroimaging  [facility] [address], [address] [place], [place] [address] [phone]

## 2017-12-19 ENCOUNTER — Ambulatory Visit: Payer: Medicare Other | Admitting: Neurology

## 2017-12-19 ENCOUNTER — Encounter: Payer: Self-pay | Admitting: Neurology

## 2018-04-28 ENCOUNTER — Ambulatory Visit: Payer: Medicare Other | Admitting: Neurology

## 2018-04-28 ENCOUNTER — Encounter: Payer: Self-pay | Admitting: Neurology

## 2018-06-05 ENCOUNTER — Other Ambulatory Visit: Payer: Self-pay

## 2018-06-05 ENCOUNTER — Ambulatory Visit: Payer: Medicare Other | Admitting: Neurology

## 2018-06-05 ENCOUNTER — Ambulatory Visit (INDEPENDENT_AMBULATORY_CARE_PROVIDER_SITE_OTHER): Payer: Medicare Other | Admitting: Neurology

## 2018-06-05 ENCOUNTER — Encounter: Payer: Self-pay | Admitting: Neurology

## 2018-06-05 VITALS — BP 112/60 | HR 60 | Resp 20 | Ht 60.0 in | Wt 226.0 lb

## 2018-06-05 DIAGNOSIS — R413 Other amnesia: Secondary | ICD-10-CM

## 2018-06-05 DIAGNOSIS — R9082 White matter disease, unspecified: Secondary | ICD-10-CM

## 2018-06-05 DIAGNOSIS — F411 Generalized anxiety disorder: Secondary | ICD-10-CM | POA: Diagnosis not present

## 2018-06-05 DIAGNOSIS — Z9989 Dependence on other enabling machines and devices: Secondary | ICD-10-CM

## 2018-06-05 DIAGNOSIS — Z8673 Personal history of transient ischemic attack (TIA), and cerebral infarction without residual deficits: Secondary | ICD-10-CM

## 2018-06-05 DIAGNOSIS — G4733 Obstructive sleep apnea (adult) (pediatric): Secondary | ICD-10-CM

## 2018-06-05 DIAGNOSIS — G43701 Chronic migraine without aura, not intractable, with status migrainosus: Secondary | ICD-10-CM

## 2018-06-05 MED ORDER — ESCITALOPRAM OXALATE 20 MG PO TABS: 20.0000 mg | ORAL_TABLET | Freq: Every day | ORAL | 11 refills | Status: AC

## 2018-06-05 NOTE — Progress Notes (Signed)
[facility]  PATIENT: Jeanne White DOB: 19-May-1951  REFERRING DOCTOR OR PCP:  Brooke Bonito SOURCE: Patient and records from [facility]  _________________________________   HISTORICAL  CHIEF COMPLAINT:  Chief Complaint  Patient presents with  . Sleep Apnea    Sts. is compliant with CPAP.  Sts. h/a's have resolved. Sts. she had a 3rd stroke 03/27/18, treated at [facility]. Sts. she was told HTN was the cause/fim  . Migraines    HISTORY OF PRESENT ILLNESS:  Jeanne White is a 67 yo woman with chronic migraine headaches, memory loss and a very abnormal MRI of the brain.      Update 06/05/2018: She had a stroke 03/27/2018 in the right posterior limb of the internal capsule.  She had weakness on the left side of her body and some numbness.    She went to [facility].   She was placed on Plavix and cholesterol was changed.    After a few days, she went to Rehab but had the onset of severe trochanteric bursitis on the left..    She does outpatient Rehab.   She is doing better,    With a walker she can go about a 100 feet.      She uses CPAP nightly.     Headaches are doing better since moving.   Her old place had carpets and new one is hardwood.   .   They are right sided but are no longer daily.   Anxiety has been worse over the past couple months.  Of note, she was on Lexapro 20 mg and that was discontinued when she was hospitalized.  She has had some episodes of tearfulness.   She continues to note some memory loss but this is unchanged compared to earlier this year..  Records from her July hospital visit at [facility] were reviewed.  Imaging and lab reports were reviewed.  I personally reviewed the MRI of the brain dated 03/28/2018.  It showed an acute stroke of 9 to 10 mm involving the posterior right internal capsule.  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  She has mild to moderate atrophy.  CT angiogram 03/29/2018 was also personally reviewed and did not show any significant stenosis in the neck or the head  Update 10/29/2017: She is experiencing migraine HA's 20-24 days/month at least 4 hours/day.   Pain is usually right > left forehead.    When one occurs, she takes caffeine and OTC med's.   She did not get benefit form Keppra, topiramate, muscle relaxants, NSAIDs and Ca channel blockers and beta blockers have not helped.     Vision is doing about the same.  She continues to have some difficulty with focus, attention and short term memory.     kkm  She notes more dizziness when the HA is worse.    Medications tried and failed for chronic Migraine:    Antiepileptics (Topamax, Keppra).  Due to obesity can not go on Depakote. Beta blockers (metoprolol); Calcium channel blockers (Norvasc) Antidepressants (Lexapro, others in past) NSAIDs - ibuprofen, naproxen, Excedrin Migraine Triptans - Imitrex, Relpax   From 04/25/2017: Etiology of her severe white matter changes is unclear and lab work has not helped to find her diagnosis.   Her main problems are headaches and memory loss. She felt better after her first Botox in 2017 but the second one did not seem to  help any.    HA's are occurring practically every day.  The worst pain is in the forehead region. She also has occipital pain.   The intensity is severe at times.    Standing up is triggering more HA  Dizziness is doing better.   If she stands up slow and takes 20-30 seconds before walking there is less lightheadedness.   She still gets dizzy if she turns around fast or bends over and gets back up.   HA's usd to be daily but imipramine has helped some.     Migraines:    Headaches are usually worse on the right than the left  She wakes up with pain most days.   She has had headaches X 40-50 years, worse over the past 10 years, right more than left.  Vision is sometimes blurry  before headache and sometimes becomes blurry when pain is worse.   Moving her head or looking down will worsen the pain. She often gets nausea but rarely has vomiting.   Keppra has helped a little bit.   Heat and wind  Triggers a migraine or intensifies existing pain.    Imitrex and other triptans have not helped in the past.   Excedrin migraine sometimes helps a short time.   An occipital nerve block/splenius capitis muscle injection helped the pain for a week or 2.    Botox did not help the pain much. She has not tried an anti-CGRP agent yet  Medications tried and failed for chronic Migraine:    Antiepileptics (Topamax, Keppra).  Due to obesity can not go on Depakote. Beta blockers (metoprolol); Calcium channel blockers (Norvasc) Antidepressants (Lexapro, others in past) NSAIDs - ibuprofen, naproxen, Excedrin Migraine Triptans - Imitrex, Relpax  Memory:    She continues to note difficulty with focus and attention. She was having difficulty at work about 5 years ago and stopped working in 2014. She is close to her sister who notes that Jeanne White has had slow worsening over time.   Mood:  She has moderate anxiety and panic attacks.   Only mild depression.  She feels stressed out easily.  She gets upset and frustrated easily.    Lexapro has helped mood  OSA:   She also has OSA treated with CPAP.  She is very compliant and uses it nightly. She sleeps much better when she wears it than when she does not.  Unfortunately, treating the sleep apnea did not help the headaches any nor memory difficulties.  Abnormal MRI History:   In 2007, she was diagnosed with Bells palsy.   She had an MRI showing severe white matter changes and was referred to Dr. Leotis Shames who diagnosed her with MS and started Copaxone.   She felt worse and began to see me in 2009 or so.   In the past, I reviewed her MRI images from 04/02/2014. There is cortical and cerebellar atrophy and extensive white matter hyperintensity. There is some  progression compared to the MRI of the brain dated 08/24/2010.  Notch 3 (CADASIL) and COL4A1 testing were negative.  Her father has hereditary hemorrhagic telangiectasia   REVIEW OF SYSTEMS: Constitutional: No fevers, chills, sweats, or change in appetite Eyes: No visual changes, double vision, eye pain Ear, nose and throat: No hearing loss, ear pain, nasal congestion, sore throat Cardiovascular: No chest pain, palpitations Respiratory: No shortness of breath at rest or with exertion.   No wheezes GastrointestinaI: No nausea, vomiting, diarrhea, abdominal pain, fecal incontinence Genitourinary: No dysuria, urinary  retention or frequency.  No nocturia. Musculoskeletal: No neck pain, back pain Integumentary: No rash, pruritus, skin lesions Neurological: as above Psychiatric: Mild depression at this time.  She has a lot of anxiety Endocrine: No palpitations, diaphoresis, change in appetite, change in weigh or increased thirst Hematologic/Lymphatic: No anemia, purpura, petechiae. Allergic/Immunologic: No itchy/runny eyes, nasal congestion, recent allergic reactions, rashes  ALLERGIES: Allergies  Allergen Reactions  . Ciprofloxacin Other (See Comments)    Headache  . Mometasone Other (See Comments)    Unknown    HOME MEDICATIONS:  Current Outpatient Medications:  .  amLODipine (NORVASC) 5 MG tablet, TK 1 T PO  D FOR BLOOD PRESSURE, Disp: , Rfl: 11 .  clopidogrel (PLAVIX) 75 MG tablet, Take by mouth., Disp: , Rfl:  .  imipramine (TOFRANIL) 25 MG tablet, Take 1 tablet (25 mg total) by mouth at bedtime., Disp: 30 tablet, Rfl: 11 .  levothyroxine (SYNTHROID, LEVOTHROID) 150 MCG tablet, Take 150 mcg by mouth daily before breakfast., Disp: , Rfl:  .  losartan (COZAAR) 25 MG tablet, TK 1 T PO  QD, Disp: , Rfl: 0 .  metoprolol succinate (TOPROL-XL) 100 MG 24 hr tablet, Take 100 mg by mouth daily. Take with or immediately following a meal., Disp: , Rfl:  .  atorvastatin (LIPITOR) 40 MG  tablet, , Disp: , Rfl:  .  escitalopram (LEXAPRO) 20 MG tablet, Take 1 tablet (20 mg total) by mouth daily., Disp: 30 tablet, Rfl: 11 .  hydrochlorothiazide (HYDRODIURIL) 25 MG tablet, TK 1 T PO  D, Disp: , Rfl: 5  PAST MEDICAL HISTORY: Past Medical History:  Diagnosis Date  . Headache   . Hypertension   . Stroke (HCC)   . Vision abnormalities     PAST SURGICAL HISTORY: Past Surgical History:  Procedure Laterality Date  . CHOLECYSTECTOMY    . PARTIAL HYSTERECTOMY      FAMILY HISTORY: Family History  Problem Relation Age of Onset  . Heart disease Mother   . Hypertension Father   . Heart disease Father     SOCIAL HISTORY:  Social History   Socioeconomic History  . Marital status: Single    Spouse name: Not on file  . Number of children: Not on file  . Years of education: Not on file  . Highest education level: Not on file  Occupational History  . Not on file  Social Needs  . Financial resource strain: Not on file  . Food insecurity:    Worry: Not on file    Inability: Not on file  . Transportation needs:    Medical: Not on file    Non-medical: Not on file  Tobacco Use  . Smoking status: Never Smoker  . Smokeless tobacco: Never Used  Substance and Sexual Activity  . Alcohol use: No    Alcohol/week: 0.0 standard drinks  . Drug use: No  . Sexual activity: Not on file  Lifestyle  . Physical activity:    Days per week: Not on file    Minutes per session: Not on file  . Stress: Not on file  Relationships  . Social connections:    Talks on phone: Not on file    Gets together: Not on file    Attends religious service: Not on file    Active member of club or organization: Not on file    Attends meetings of clubs or organizations: Not on file    Relationship status: Not on file  . Intimate partner violence:  Fear of current or ex partner: Not on file    Emotionally abused: Not on file    Physically abused: Not on file    Forced sexual activity: Not on  file  Other Topics Concern  . Not on file  Social History Narrative  . Not on file     PHYSICAL EXAM  Vitals:   06/05/18 0937  BP: 112/60  Pulse: 60  Resp: 20  Weight: 226 lb (102.5 kg)  Height: 5' (1.524 m)    Body mass index is 44.14 kg/m.   General: The patient is well-developed and well-nourished and in no acute distress  Neck: The neck isnontender today  Neurologic Exam  Mental status: The patient is alert and oriented x 3 at the time of the examination. The patient has apparent normal recent and remote memory, with mildly reduced attention span and concentration ability.   Speech is normal.  Cranial nerves: Extraocular movements are full.   There is good facial sensation to soft touch bilaterally. Facial strength is normal.  Trapezius and sternocleidomastoid strength is normal. No dysarthria is noted.  The tongue is midline, and the patient has symmetric elevation of the soft palate. No obvious hearing deficits are noted.  Motor:  Muscle bulk is normal.   Tone is normal. Strength is  5 / 5 in all 4 extremities.   Sensory: Sensory testing is intact to touch and vibration sensation in all 4 extremities.  Coordination: Cerebellar testing reveals good finger-nose-finger bilaterally.   Heel-to-shin is mildly reduced.  Gait and station: Station is normal.   Gait is is minimally wide. Tandem gait is wide. Romberg is negative.   Reflexes: Deep tendon reflexes are increased in legs bilaterally.  .  Dix-Hallpike maneuver did not evoke vertigo or nystagmus    DIAGNOSTIC DATA (LABS, IMAGING, TESTING) - I reviewed patient records, labs, notes, testing and imaging myself where available.     ASSESSMENT AND PLAN  OSA on CPAP  History of ischemic stroke in prior three months  White matter disease, unspecified  Anxiety state  Memory loss  Chronic migraine without aura with status migrainosus, not intractable    1.    Continue Plavix. 2.   She should continue  the therapy and exercise as tolerated and stay active.  Try to lose some more weight 3.    Continue Lexapro for anxiety.    4.   Continue CPAP every night. 5.   She will return to see me in 6 months or sooner if she has new or worsening neurologic symptoms.     Richard A. Epimenio FootSater, MD, PhD 06/05/2018, 1:03 PM Certified in Neurology, Clinical Neurophysiology, Sleep Medicine, Pain Medicine and Neuroimaging  [facility] [address], [address] [place], [place] [address] [phone]

## 2018-10-22 ENCOUNTER — Telehealth: Payer: Self-pay | Admitting: Neurology

## 2018-10-22 NOTE — Telephone Encounter (Signed)
Pt sister(on DPR) is asking for a call from RN before scheduled appointment to discuss concerns with pt.  Sister states she is concerned with memory of pt.  Pt is often wet and having to heavily rely on adult diapers(Depends).  She has a bad knee and is over weight. Sister states there are noticeable concerns with memory that she'd like to discuss with RN, please call

## 2018-10-22 NOTE — Telephone Encounter (Signed)
Spoke with [name] (on hippa). She sts. pt's memory is worse. Sts. pt. lives in an independent living facility, is having a harder time with paying bills. sts. pt. is  physically unable to do laundry due to wt, bad knee. Sts. sheis having  more difficulty with urinary incontinence. I have explained pt. should see pcp/ortho for knee, and pcp/urology for bladder dysfunction. Difficulty with memory is not new for pt.--Dr. Epimenio Foot will eval at pending March appt., and order any further nec. testing at that time/fim

## 2018-11-12 ENCOUNTER — Encounter: Payer: Self-pay | Admitting: Neurology

## 2018-12-04 ENCOUNTER — Ambulatory Visit: Payer: Medicare Other | Admitting: Neurology

## 2019-02-11 ENCOUNTER — Telehealth: Payer: Self-pay | Admitting: *Deleted

## 2019-02-11 NOTE — Telephone Encounter (Signed)
I called and spoke with [name] (friend) on DPR about appt scheduled tomorrow at 1130am. She is in [facility] right now. She is her POA. However Brion was at [facility] for a week after a fall from 01/18/19-01/24/19. She is now at [facility]. She is on aricept for memory issues. She has not had a report since last Tuesday from facility. She is fall risk and in a wheelchair most of the time. She is able to walk about 1103ft with assistance.   Facility info: [facility] [place] Address: [address] [place], [place] [address] Hours: Closes 5PM Phone: [phone]  I called facility above. Spoke with Paris. She transferred me to unit pt is on. Spoke with [employer]. She placed me on hold to see if virtual visit was possible. Olivia Mackie Associate Professor approved. I emailed her link for VV at [email or web address]. She confirmed she received link. Explained instructions. I called Barbara back. Emailed her link at [email or web address]'. Verified she received while on the phone. Advised Dr. Epimenio Foot can merge video call. She verbalized understanding and appreciation.

## 2019-02-12 ENCOUNTER — Ambulatory Visit (INDEPENDENT_AMBULATORY_CARE_PROVIDER_SITE_OTHER): Payer: Medicare Other | Admitting: Neurology

## 2019-02-12 ENCOUNTER — Encounter: Payer: Self-pay | Admitting: Neurology

## 2019-02-12 ENCOUNTER — Telehealth: Payer: Self-pay | Admitting: Neurology

## 2019-02-12 ENCOUNTER — Other Ambulatory Visit: Payer: Self-pay

## 2019-02-12 DIAGNOSIS — R9089 Other abnormal findings on diagnostic imaging of central nervous system: Secondary | ICD-10-CM | POA: Diagnosis not present

## 2019-02-12 DIAGNOSIS — R413 Other amnesia: Secondary | ICD-10-CM | POA: Diagnosis not present

## 2019-02-12 DIAGNOSIS — R269 Unspecified abnormalities of gait and mobility: Secondary | ICD-10-CM | POA: Diagnosis not present

## 2019-02-12 DIAGNOSIS — M542 Cervicalgia: Secondary | ICD-10-CM

## 2019-02-12 DIAGNOSIS — W19XXXA Unspecified fall, initial encounter: Secondary | ICD-10-CM

## 2019-02-12 NOTE — Telephone Encounter (Signed)
Patient is scheduled for 02/22/19.

## 2019-02-12 NOTE — Telephone Encounter (Signed)
Correction patient will be scheduled at [facility] I spoke to them and they will reach out to get the patient schedule.

## 2019-02-12 NOTE — Progress Notes (Signed)
[facility]  PATIENT: Jeanne White DOB: [birth year]  REFERRING DOCTOR OR PCP:  Brooke Bonito SOURCE: Patient and records from [facility]  _________________________________   HISTORICAL  CHIEF COMPLAINT:  Chief Complaint  Patient presents with  . Fall  . Gait Problem  . Other    abnl brain MRI  . Memory Loss    HISTORY OF PRESENT ILLNESS:  Jeanne White is a 68 yo woman with chronic migraine headaches, memory loss and a very abnormal MRI of the brain.      Update 02/12/2019: Virtual Visit via Video Note I connected with Jeanne White  And Jeanne [facility], separate site) on 02/12/19 at 11:30 AM EDT by a video enabled telemedicine application and verified that I am speaking with the correct person.  I discussed the limitations of evaluation and management by telemedicine and the availability of in person appointments. The patient expressed understanding and agreed to proceed.  History of Present Illness: Jeanne White had several falls in early May and presented to [facility] emergency room 01/18/2019.  A CT scan did not show any acute findings.  I personally reviewed the CT scan images.  She has severe white matter changes.  There are no hemorrhages or other acute findings.  She has atrophy.  I compared the brain to a CT scan and MRI from July 2019 and the findings are similar.  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  She is on Plavix for the stroke.  She was admitted to a rehab facility  [facility] [place] Address: [address] [place], [place] [address] Hours: Closes 5PM Phone: [phone]) and is doing physical therapy.  According to communication with their staff yesterday, she is able to walk on and 25 feet with a walker.  Of note, she was independent with gait before her stroke and began to use a  wheelchair much more.  She was still living in an independent living facility but would scoot around using her feet to move the wheelchair.  She also has memory issues.  She has severe white matter changes on MRI but an etiology has not been found.  Calcium was negative.  Memory problems began many years ago.  Additionally she has a long history of headaches but she reports that her headaches have actually done quite a bit better the last few weeks.   She also has depression but mood has done reasonably well lately.  Observations/Objective:   She is a well-developed well-nourished woman in no acute distress.  The head is normocephalic and atraumatic.  Sclera are anicteric.  Visible skin appears normal.  The neck has a good range of motion.    She is alert and fully oriented with fluent speech and good attention, knowledge and memory.  Extraocular muscles are intact.  Facial strength is normal.   She appears to have normal strength in the arms and Rapid alternating movements are performed well.  Gait, strength and reflexes could not be tested.  Assessment and Plan: Abnormal brain MRI  Gait disturbance - Plan: MR CERVICAL SPINE WO CONTRAST  Morbid obesity (HCC)  Fall, initial encounter - Plan: MR CERVICAL SPINE WO CONTRAST  Memory loss  Neck pain   1.  Her frequent falls likely related to the white matter changes in the brain and her more recent stroke the course symptoms on the left.  However, we  need to also rule out cervical myelopathy and I will have an MRI of the cervical spine performed and reviewed the results and images. 2.   Continue with physical therapy, she appears to be showing some improvement. 3.  I had a discussion with her and [name] about the possibility of returning to independent living.  Unless she is able to get around an apartment by herself she will need to go to an environment with more care.  Follow Up Instructions: I discussed the assessment and treatment plan  with the patient. The patient was provided an opportunity to ask questions and all were answered. The patient agreed with the plan and demonstrated an understanding of the instructions.    The patient was advised to call back or seek an in-person evaluation if the symptoms worsen or if the condition fails to improve as anticipated.  I provided  40 minutes of non-face-to-face time during this encounter. _____________________________________________  Update 06/05/2018: She had a stroke 03/27/2018 in the right posterior limb of the internal capsule.  She had weakness on the left side of her body and some numbness.    She went to [facility].   She was placed on Plavix and cholesterol was changed.    After a few days, she went to Rehab but had the onset of severe trochanteric bursitis on the left..    She does outpatient Rehab.   She is doing better,    With a walker she can go about a 100 feet.      She uses CPAP nightly.     Headaches are doing better since moving.   Her old place had carpets and new one is hardwood.   .   They are right sided but are no longer daily.   Anxiety has been worse over the past couple months.  Of note, she was on Lexapro 20 mg and that was discontinued when she was hospitalized.  She has had some episodes of tearfulness.   She continues to note some memory loss but this is unchanged compared to earlier this year..  Records from her July hospital visit at [facility] were reviewed.  Imaging and lab reports were reviewed.  I personally reviewed the MRI of the brain dated 03/28/2018.  It showed an acute stroke of 9 to 10 mm involving the posterior right internal capsule.  Additionally, she has severe white matter changes, similar to her previous MRI and changes consistent with several other lacunar infarctions.  She has mild to moderate atrophy.  CT angiogram 03/29/2018 was also personally reviewed and did not show any significant stenosis in the neck or the head   Update 10/29/2017: She is experiencing migraine HA's 20-24 days/month at least 4 hours/day.   Pain is usually right > left forehead.    When one occurs, she takes caffeine and OTC med's.   She did not get benefit form Keppra, topiramate, muscle relaxants, NSAIDs and Ca channel blockers and beta blockers have not helped.     Vision is doing about the same.  She continues to have some difficulty with focus, attention and short term memory.     kkm  She notes more dizziness when the HA is worse.    Medications tried and failed for chronic Migraine:    Antiepileptics (Topamax, Keppra).  Due to obesity can not go on Depakote. Beta blockers (metoprolol); Calcium channel blockers (Norvasc) Antidepressants (Lexapro, others in past) NSAIDs - ibuprofen, naproxen, Excedrin Migraine Triptans - Imitrex, Relpax  From 04/25/2017: Etiology of her severe white matter changes is unclear and lab work has not helped to find her diagnosis.   Her main problems are headaches and memory loss. She felt better after her first Botox in 2017 but the second one did not seem to help any.    HA's are occurring practically every day.  The worst pain is in the forehead region. She also has occipital pain.   The intensity is severe at times.    Standing up is triggering more HA  Dizziness is doing better.   If she stands up slow and takes 20-30 seconds before walking there is less lightheadedness.   She still gets dizzy if she turns around fast or bends over and gets back up.   HA's usd to be daily but imipramine has helped some.     Migraines:    Headaches are usually worse on the right than the left  She wakes up with pain most days.   She has had headaches X 40-50 years, worse over the past 10 years, right more than left.  Vision is sometimes blurry before headache and sometimes becomes blurry when pain is worse.   Moving her head or looking down will worsen the pain. She often gets nausea but rarely has vomiting.   Keppra has  helped a little bit.   Heat and wind  Triggers a migraine or intensifies existing pain.    Imitrex and other triptans have not helped in the past.   Excedrin migraine sometimes helps a short time.   An occipital nerve block/splenius capitis muscle injection helped the pain for a week or 2.    Botox did not help the pain much. She has not tried an anti-CGRP agent yet  Medications tried and failed for chronic Migraine:    Antiepileptics (Topamax, Keppra).  Due to obesity can not go on Depakote. Beta blockers (metoprolol); Calcium channel blockers (Norvasc) Antidepressants (Lexapro, others in past) NSAIDs - ibuprofen, naproxen, Excedrin Migraine Triptans - Imitrex, Relpax  Memory:    She continues to note difficulty with focus and attention. She was having difficulty at work about 5 years ago and stopped working in 2014. She is close to her sister who notes that Jamesetta Sohyllis has had slow worsening over time.   Mood:  She has moderate anxiety and panic attacks.   Only mild depression.  She feels stressed out easily.  She gets upset and frustrated easily.    Lexapro has helped mood  OSA:   She also has OSA treated with CPAP.  She is very compliant and uses it nightly. She sleeps much better when she wears it than when she does not.  Unfortunately, treating the sleep apnea did not help the headaches any nor memory difficulties.  Abnormal MRI History:   In 2007, she was diagnosed with Bells palsy.   She had an MRI showing severe white matter changes and was referred to Dr. Leotis ShamesJeffery who diagnosed her with MS and started Copaxone.   She felt worse and began to see me in 2009 or so.   In the past, I reviewed her MRI images from 04/02/2014. There is cortical and cerebellar atrophy and extensive white matter hyperintensity. There is some progression compared to the MRI of the brain dated 08/24/2010.  Notch 3 (CADASIL) and COL4A1 testing were negative.  Her father has hereditary hemorrhagic telangiectasia   REVIEW OF  SYSTEMS: Constitutional: No fevers, chills, sweats, or change in appetite Eyes: No visual changes, double  vision, eye pain Ear, nose and throat: No hearing loss, ear pain, nasal congestion, sore throat Cardiovascular: No chest pain, palpitations Respiratory: No shortness of breath at rest or with exertion.   No wheezes GastrointestinaI: No nausea, vomiting, diarrhea, abdominal pain, fecal incontinence Genitourinary: No dysuria, urinary retention or frequency.  No nocturia. Musculoskeletal: No neck pain, back pain Integumentary: No rash, pruritus, skin lesions Neurological: as above Psychiatric: Mild depression at this time.  She has a lot of anxiety Endocrine: No palpitations, diaphoresis, change in appetite, change in weigh or increased thirst Hematologic/Lymphatic: No anemia, purpura, petechiae. Allergic/Immunologic: No itchy/runny eyes, nasal congestion, recent allergic reactions, rashes  ALLERGIES: Allergies  Allergen Reactions  . Ciprofloxacin Other (See Comments)    Headache  . Mometasone Other (See Comments)    Unknown    HOME MEDICATIONS:  Current Outpatient Medications:  .  amLODipine (NORVASC) 5 MG tablet, TK 1 T PO  D FOR BLOOD PRESSURE, Disp: , Rfl: 11 .  atorvastatin (LIPITOR) 40 MG tablet, , Disp: , Rfl:  .  escitalopram (LEXAPRO) 20 MG tablet, Take 1 tablet (20 mg total) by mouth daily., Disp: 30 tablet, Rfl: 11 .  hydrochlorothiazide (HYDRODIURIL) 25 MG tablet, TK 1 T PO  D, Disp: , Rfl: 5 .  imipramine (TOFRANIL) 25 MG tablet, Take 1 tablet (25 mg total) by mouth at bedtime., Disp: 30 tablet, Rfl: 11 .  levothyroxine (SYNTHROID, LEVOTHROID) 150 MCG tablet, Take 150 mcg by mouth daily before breakfast., Disp: , Rfl:  .  losartan (COZAAR) 25 MG tablet, TK 1 T PO  QD, Disp: , Rfl: 0 .  metoprolol succinate (TOPROL-XL) 100 MG 24 hr tablet, Take 100 mg by mouth daily. Take with or immediately following a meal., Disp: , Rfl:   PAST MEDICAL HISTORY: Past Medical  History:  Diagnosis Date  . Headache   . Hypertension   . Stroke (HCC)   . Vision abnormalities     PAST SURGICAL HISTORY: Past Surgical History:  Procedure Laterality Date  . CHOLECYSTECTOMY    . PARTIAL HYSTERECTOMY      FAMILY HISTORY: Family History  Problem Relation Age of Onset  . Heart disease Mother   . Hypertension Father   . Heart disease Father     SOCIAL HISTORY:  Social History   Socioeconomic History  . Marital status: Single    Spouse name: Not on file  . Number of children: Not on file  . Years of education: Not on file  . Highest education level: Not on file  Occupational History  . Not on file  Social Needs  . Financial resource strain: Not on file  . Food insecurity:    Worry: Not on file    Inability: Not on file  . Transportation needs:    Medical: Not on file    Non-medical: Not on file  Tobacco Use  . Smoking status: Never Smoker  . Smokeless tobacco: Never Used  Substance and Sexual Activity  . Alcohol use: No    Alcohol/week: 0.0 standard drinks  . Drug use: No  . Sexual activity: Not on file  Lifestyle  . Physical activity:    Days per week: Not on file    Minutes per session: Not on file  . Stress: Not on file  Relationships  . Social connections:    Talks on phone: Not on file    Gets together: Not on file    Attends religious service: Not on file    Active member  of club or organization: Not on file    Attends meetings of clubs or organizations: Not on file    Relationship status: Not on file  . Intimate partner violence:    Fear of current or ex partner: Not on file    Emotionally abused: Not on file    Physically abused: Not on file    Forced sexual activity: Not on file  Other Topics Concern  . Not on file  Social History Narrative  . Not on file    Richard A. Epimenio Foot, MD, PhD 02/12/2019, 12:40 PM Certified in Neurology, Clinical Neurophysiology, Sleep Medicine, Pain Medicine and Neuroimaging  [facility] [address] [place], [place] [address] [phone]

## 2019-02-12 NOTE — Telephone Encounter (Signed)
Medicare/aetna supp order sent to GI. No auth they will reach out to the patient to schedule.  

## 2019-02-15 NOTE — Telephone Encounter (Signed)
Note created in error.

## 2019-02-22 ENCOUNTER — Other Ambulatory Visit: Payer: Self-pay

## 2019-02-22 ENCOUNTER — Ambulatory Visit (INDEPENDENT_AMBULATORY_CARE_PROVIDER_SITE_OTHER): Payer: Medicare Other

## 2019-02-22 DIAGNOSIS — W19XXXA Unspecified fall, initial encounter: Secondary | ICD-10-CM

## 2019-02-22 DIAGNOSIS — R269 Unspecified abnormalities of gait and mobility: Secondary | ICD-10-CM

## 2019-02-24 ENCOUNTER — Telehealth: Payer: Self-pay | Admitting: *Deleted

## 2019-02-24 NOTE — Telephone Encounter (Signed)
Called, LVM for Pam[facility] Hurry [facility]) about MRI results per Dr. Felecia Shelling note. Gave [facility] phone number if she has further questions/concerns.

## 2019-02-24 NOTE — Telephone Encounter (Signed)
-----   Message from Britt Bottom, MD sent at 02/23/2019  5:11 PM EDT ----- Please let her or her sister know that the MRI showed degenerative changes including spinal stenosis at C5-C6.  However, the degenerative changes did not look to be severe enough to affect her gait

## 2019-09-29 ENCOUNTER — Telehealth: Payer: Self-pay | Admitting: Neurology

## 2019-09-29 NOTE — Telephone Encounter (Signed)
I called and spoke with [name]. Jeanne White is in [facility] assisted living in [place], [place] right now as of July 2020. She is concerned about care she is getting, if she is getting her medications. She was last seen 01/2019. Current meds: Depakote, Plavix, Lipitor, Aricept, Lexapro.  Seroquel recently added because she is acting out. She wants to stay in bed a lot. Last month, pt was having episodes where she slides out of her wheelchair. She is screaming/lashing out/telling people to go away. She wear depends now, needing to be changed every two hours. She face timed her sister yesterday around lunchtime. She asked her where she was and she replied that she was at the mall and went to [employer] for lunch. I recommended we try and get a VV set up with Dr. Epimenio Foot. She is going to reach out to [facility] [place] [facility] to see if he can log on for VV 10/01/19 at 130pm with Dr. Epimenio Foot. She will call back to let me know if this works. His email: [email or web address]. Phone: [phone]. If it works, I will then reach out to [facility] to try and schedule.

## 2019-09-29 NOTE — Telephone Encounter (Signed)
Pt's sister Ellen Henri on DPR would like to speak to RN about the pt's care, medication and memory loss. Please advise.

## 2019-09-29 NOTE — Telephone Encounter (Signed)
Horton,Barbara has called back to inform Kara Mead, RN  That Thursday @ 1pm will not work.  Ellen Henri is asking that Kara Mead schedules with [facility] and gets back with her from there

## 2019-09-30 NOTE — Telephone Encounter (Signed)
Called and spoke with Jeanne White. Relayed we set up VV with [facility] for 10/05/19 at 1:30pm. I emailed her link for VV to [email or web address]. Asked her to call back if she does not receive it.  She verbalized understanding.

## 2019-09-30 NOTE — Telephone Encounter (Signed)
Called [facility] at [phone] and spoke w/ Lawson Fiscal. She transferred me to [facility] and we scheduled VV for 10/05/19 at 1:30pm with Dr. Epimenio Foot. I emailed link for visit to [facility] per her request at: [email or web address].  I asked that they fax updated medication list to [place] at [phone]. She verbalized understanding and will fax.

## 2019-10-05 ENCOUNTER — Encounter: Payer: Self-pay | Admitting: Neurology

## 2019-10-05 ENCOUNTER — Telehealth: Payer: Self-pay | Admitting: *Deleted

## 2019-10-05 ENCOUNTER — Ambulatory Visit (INDEPENDENT_AMBULATORY_CARE_PROVIDER_SITE_OTHER): Payer: Medicare Other | Admitting: Neurology

## 2019-10-05 DIAGNOSIS — R9082 White matter disease, unspecified: Secondary | ICD-10-CM

## 2019-10-05 DIAGNOSIS — F411 Generalized anxiety disorder: Secondary | ICD-10-CM | POA: Diagnosis not present

## 2019-10-05 DIAGNOSIS — R413 Other amnesia: Secondary | ICD-10-CM | POA: Diagnosis not present

## 2019-10-05 DIAGNOSIS — R269 Unspecified abnormalities of gait and mobility: Secondary | ICD-10-CM

## 2019-10-05 DIAGNOSIS — G43701 Chronic migraine without aura, not intractable, with status migrainosus: Secondary | ICD-10-CM

## 2019-10-05 NOTE — Telephone Encounter (Signed)
Called [facility] and spoke with [employer]. Scheduled 6 month f/u for 04/04/20 at 11:30am.  I called and spoke with sister, [name] and informed her of scheduled appt. She verbalized understanding.   She is wondering if Dr. Epimenio Foot was able to send orders to [facility] re: donepezil. Advised I will f/u with him to see about this.

## 2019-10-05 NOTE — Telephone Encounter (Addendum)
I called [facility] back to get update since I have not gotten a call back. They stated Jeanne White was just about to call me, he was working from home. I resent e-mail to him. She placed me on hold and I then got transferred to Richard. I explained situation to him. He asked that I forward him the email for doxy visit at: [email or web address]. He confirmed receipt. He will help pt with VV. He will call back if any further issues.   I updated medication list from medication list from fax we received last week.

## 2019-10-05 NOTE — Telephone Encounter (Signed)
Called and spoke w/ [facility]. She states both Beth and [facility] not there today and that is who we set up VV with. She at first wanted to r/s but I advised we had this set up last week with them. She states [employer] in [employer] office. They were not aware of VV. She placed me on hold and then came back and states she is going to call back in 10-15min to let me know if Gerlene Burdock, the executive director can help with the VV for pt. I provided her office number and advised her to ask phone staff to see if I can pick up call when she calls back. She verbalized understanding.

## 2019-10-05 NOTE — Progress Notes (Signed)
[facility]  PATIENT: Jeanne White DOB: Sep 22, 1950  REFERRING DOCTOR OR PCP:  Jeanne White SOURCE: Patient and records from [facility]  _________________________________   HISTORICAL  CHIEF COMPLAINT:  Chief Complaint  Patient presents with  . Gait Problem  . Headache  . Memory Loss    HISTORY OF PRESENT ILLNESS:  Jeanne White is a 69 yo woman with chronic migraine headaches, memory loss, abnormal MRI of the brain with extensive white matter changes and atrophy who had a stroke in 2019.Marland Kitchen      Update 10/04/18 (virtual) Separately, I did a video visit with Jeanne White and also with her sister who lives in [place].  Jeanne White is currently a resident at [facility].  She was unable to live by herself after her stroke.  Gait has declined further and she now is almost entirely wheelchair-bound though she can transfer by herself or with limited help.  She has stepped out of her chair to the floor a few times.  She has a lot of difficulties with incontinence both bowel and bladder.   Memory is poor.  She was started on donepezil but her sister does not think that there was any improvement.  More recently, she has had some behavioral issues and was placed on Depakote initially and increase to 500 mg twice a day.  Due to trouble tolerating that dose she was reduced back to 250 mg twice daily and Seroquel 25 mg twice daily was added.  She appears to be tolerating that regimen well.  Headaches are actually doing better.  They now occur about 2 or 3 times a week.    She uses CPAP nightly and has no trouble using it.    Update 02/12/2019 (Virtual) Jeanne White had several falls in early May and presented to [facility] room 01/18/2019.  A CT scan did not show any acute findings.  I personally reviewed the CT scan images.  She has severe white matter changes.  There are no hemorrhages or other acute findings.  She has atrophy.  I compared  the brain to a CT scan and MRI from July 2019 and the findings are similar.  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  She is on Plavix for the stroke.  She was admitted to a rehab facility  [facility] [place] Address: [address] [place], [place] [address] Hours: Closes 5PM Phone: [phone]) and is doing physical therapy.  According to communication with their staff yesterday, she is able to walk on and 25 feet with a walker.  Of note, she was independent with gait before her stroke and began to use a wheelchair much more.  She was still living in an independent living facility but would scoot around using her feet to move the wheelchair.  She also has memory issues.  She has severe white matter changes on MRI but an etiology has not been found.  Calcium was negative.  Memory problems began many years ago.  Additionally she has a long history of headaches but she reports that her headaches have actually done quite a bit better the last few weeks.   She also has depression but mood has done reasonably well lately.   Update 06/05/2018: She had a stroke 03/27/2018 in the right posterior limb of the internal capsule.  She had weakness on the left side of her body and  some numbness.    She went to [facility].   She was placed on Plavix and cholesterol was changed.    After a few days, she went to Rehab but had the onset of severe trochanteric bursitis on the left..    She does outpatient Rehab.   She is doing better,    With a walker she can go about a 100 feet.      She uses CPAP nightly.     Headaches are doing better since moving.   Her old place had carpets and new one is hardwood.   .   They are right sided but are no longer daily.   Anxiety has been worse over the past couple months.  Of note, she was on Lexapro 20 mg and that was discontinued when she was hospitalized.  She has had  some episodes of tearfulness.   She continues to note some memory loss but this is unchanged compared to earlier this year..  Records from her July hospital visit at [facility] were reviewed.  Imaging and lab reports were reviewed.  I personally reviewed the MRI of the brain dated 03/28/2018.  It showed an acute stroke of 9 to 10 mm involving the posterior right internal capsule.  Additionally, she has severe white matter changes, similar to her previous MRI and changes consistent with several other lacunar infarctions.  She has mild to moderate atrophy.  CT angiogram 03/29/2018 was also personally reviewed and did not show any significant stenosis in the neck or the head  Update 10/29/2017: She is experiencing migraine HA's 20-24 days/month at least 4 hours/day.   Pain is usually right > left forehead.    When one occurs, she takes caffeine and OTC med's.   She did not get benefit form Keppra, topiramate, muscle relaxants, NSAIDs and Ca channel blockers and beta blockers have not helped.     Vision is doing about the same.  She continues to have some difficulty with focus, attention and short term memory.     kkm  She notes more dizziness when the HA is worse.    Medications tried and failed for chronic Migraine:    Antiepileptics (Topamax, Keppra).  Due to obesity can not go on Depakote. Beta blockers (metoprolol); Calcium channel blockers (Norvasc) Antidepressants (Lexapro, others in past) NSAIDs - ibuprofen, naproxen, Excedrin Migraine Triptans - Imitrex, Relpax   From 04/25/2017: Etiology of her severe white matter changes is unclear and lab work has not helped to find her diagnosis.   Her main problems are headaches and memory loss. She felt better after her first Botox in 2017 but the second one did not seem to help any.    HA's are occurring practically every day.  The worst pain is in the forehead region. She also has occipital pain.   The intensity is severe at times.     Standing up is triggering more HA  Dizziness is doing better.   If she stands up slow and takes 20-30 seconds before walking there is less lightheadedness.   She still gets dizzy if she turns around fast or bends over and gets back up.   HA's usd to be daily but imipramine has helped some.     Migraines:    Headaches are usually worse on the right than the left  She wakes up with pain most days.   She has had headaches X 40-50 years, worse over the past 10 years, right more than left.  Vision is sometimes blurry before headache and sometimes becomes blurry when pain is worse.   Moving her head or looking down will worsen the pain. She often gets nausea but rarely has vomiting.   Keppra has helped a little bit.   Heat and wind  Triggers a migraine or intensifies existing pain.    Imitrex and other triptans have not helped in the past.   Excedrin migraine sometimes helps a short time.   An occipital nerve block/splenius capitis muscle injection helped the pain for a week or 2.    Botox did not help the pain much. She has not tried an anti-CGRP agent yet  Medications tried and failed for chronic Migraine:    Antiepileptics (Topamax, Keppra).  Due to obesity can not go on Depakote. Beta blockers (metoprolol); Calcium channel blockers (Norvasc) Antidepressants (Lexapro, others in past) NSAIDs - ibuprofen, naproxen, Excedrin Migraine Triptans - Imitrex, Relpax  Memory:    She continues to note difficulty with focus and attention. She was having difficulty at work about 5 years ago and stopped working in 2014. She is close to her sister who notes that Vannary has had slow worsening over time.   Mood:  She has moderate anxiety and panic attacks.   Only mild depression.  She feels stressed out easily.  She gets upset and frustrated easily.    Lexapro has helped mood  OSA:   She also has OSA treated with CPAP.  She is very compliant and uses it nightly. She sleeps much better when she wears it than when she  does not.  Unfortunately, treating the sleep apnea did not help the headaches any nor memory difficulties.  Abnormal MRI History:   In 2007, she was diagnosed with Bells palsy.   She had an MRI showing severe white matter changes and was referred to Dr. Leotis Shames who diagnosed her with MS and started Copaxone.   She felt worse and began to see me in 2009 or so.   In the past, I reviewed her MRI images from 04/02/2014. There is cortical and cerebellar atrophy and extensive white matter hyperintensity. There is some progression compared to the MRI of the brain dated 08/24/2010.  Notch 3 (CADASIL) and COL4A1 testing were negative.  Her father has hereditary hemorrhagic telangiectasia   REVIEW OF SYSTEMS: Constitutional: No fevers, chills, sweats, or change in appetite Eyes: No visual changes, double vision, eye pain Ear, nose and throat: No hearing loss, ear pain, nasal congestion, sore throat Cardiovascular: No chest pain, palpitations Respiratory: No shortness of breath at rest or with exertion.   No wheezes GastrointestinaI: No nausea, vomiting, diarrhea, abdominal pain, fecal incontinence Genitourinary: No dysuria, urinary retention or frequency.  No nocturia. Musculoskeletal: No neck pain, back pain Integumentary: No rash, pruritus, skin lesions Neurological: as above Psychiatric: Mild depression at this time.  She has a lot of anxiety Endocrine: No palpitations, diaphoresis, change in appetite, change in weigh or increased thirst Hematologic/Lymphatic: No anemia, purpura, petechiae. Allergic/Immunologic: No itchy/runny eyes, nasal congestion, recent allergic reactions, rashes  ALLERGIES: Allergies  Allergen Reactions  . Ciprofloxacin Other (See Comments)    Headache  . Mometasone Other (See Comments)    Unknown    HOME MEDICATIONS:  Current Outpatient Medications:  .  atorvastatin (LIPITOR) 40 MG tablet, Take 40 mg by mouth. , Disp: , Rfl:  .  clopidogrel (PLAVIX) 75 MG tablet,  Take 75 mg by mouth daily., Disp: , Rfl:  .  divalproex (DEPAKOTE) 125 MG DR tablet,  Take 125 mg by mouth 2 (two) times daily., Disp: , Rfl:  .  donepezil (ARICEPT) 10 MG tablet, Take 10 mg by mouth at bedtime., Disp: , Rfl:  .  escitalopram (LEXAPRO) 20 MG tablet, Take 1 tablet (20 mg total) by mouth daily., Disp: 30 tablet, Rfl: 11 .  guaiFENesin-codeine 100-10 MG/5ML syrup, Take 30 mLs by mouth every 6 (six) hours as needed for cough., Disp: , Rfl:  .  levothyroxine (SYNTHROID, LEVOTHROID) 150 MCG tablet, Take 150 mcg by mouth daily before breakfast., Disp: , Rfl:  .  Loperamide HCl (IMODIUM PO), Take by mouth as needed., Disp: , Rfl:  .  LORazepam (ATIVAN) 0.5 MG tablet, Take 0.25 mg by mouth every 12 (twelve) hours as needed for anxiety., Disp: , Rfl:  .  losartan (COZAAR) 50 MG tablet, Take 50 mg by mouth daily., Disp: , Rfl:  .  metoprolol succinate (TOPROL-XL) 50 MG 24 hr tablet, Take 50 mg by mouth daily. Take with or immediately following a meal., Disp: , Rfl:  .  ondansetron (ZOFRAN) 4 MG tablet, Take 4 mg by mouth every 8 (eight) hours as needed for nausea or vomiting., Disp: , Rfl:  .  QUEtiapine (SEROQUEL) 25 MG tablet, Take 25 mg by mouth 2 (two) times daily., Disp: , Rfl:  .  UNABLE TO FIND, at bedtime. Med Name: CPAP machine, Disp: , Rfl:   PAST MEDICAL HISTORY: Past Medical History:  Diagnosis Date  . Headache   . Hypertension   . Stroke (HCC)   . Vision abnormalities     PAST SURGICAL HISTORY: Past Surgical History:  Procedure Laterality Date  . CHOLECYSTECTOMY    . PARTIAL HYSTERECTOMY      FAMILY HISTORY: Family History  Problem Relation Age of Onset  . Heart disease Mother   . Hypertension Father   . Heart disease Father     SOCIAL HISTORY:  Social History   Socioeconomic History  . Marital status: Single    Spouse name: Not on file  . Number of children: Not on file  . Years of education: Not on file  . Highest education level: Not on file    Occupational History  . Not on file  Tobacco Use  . Smoking status: Never Smoker  . Smokeless tobacco: Never Used  Substance and Sexual Activity  . Alcohol use: No    Alcohol/week: 0.0 standard drinks  . Drug use: No  . Sexual activity: Not on file  Other Topics Concern  . Not on file  Social History Narrative  . Not on file   Social Determinants of Health   Financial Resource Strain:   . Difficulty of Paying Living Expenses: Not on file  Food Insecurity:   . Worried About Programme researcher, broadcasting/film/videounning Out of Food in the Last Year: Not on file  . Ran Out of Food in the Last Year: Not on file  Transportation Needs:   . Lack of Transportation (Medical): Not on file  . Lack of Transportation (Non-Medical): Not on file  Physical Activity:   . Days of Exercise per Week: Not on file  . Minutes of Exercise per Session: Not on file  Stress:   . Feeling of Stress : Not on file  Social Connections:   . Frequency of Communication with Friends and Family: Not on file  . Frequency of Social Gatherings with Friends and Family: Not on file  . Attends Religious Services: Not on file  . Active Member of Clubs or Organizations: Not  on file  . Attends Archivist Meetings: Not on file  . Marital Status: Not on file  Intimate Partner Violence:   . Fear of Current or Ex-Partner: Not on file  . Emotionally Abused: Not on file  . Physically Abused: Not on file  . Sexually Abused: Not on file   Physical EXAM  She is a well-developed well-nourished woman in no acute distress.  The head is normocephalic and atraumatic.  Sclera are anicteric.  Visible skin appears normal.  The neck has a good range of motion.  Pharynx and tongue have normal appearance.  She is alert with fluent speech but reduced focus/attention and memory.  Horizontal gaze was normal.  She appears to have reduced upgaze though this is difficult to assess by virtual visit.  Facial strength is normal.  Palatal elevation and tongue protrusion  are midline.  She appears to have normal strength in the arms.  Rapid alternating movements and finger-nose-finger are performed reasonably well.  ___________________________________  No diagnosis found.  1.   Poor gait is probably multifactorial due to the severe white matter changes on the brain, history of stroke, obesity, degenerative arthritis in her knees and deconditioning.  It is possible that the spinal stenosis is playing some role though the spinal cord had normal signal so she does not appear to be having a myelopathy.  Advised to try to exercise some while she is sitting. 2.   Memory has slowly declined.  She did not appear to get any benefit from the donepezil.  Of note, cognitive changes began more than a decade ago and are unlikely to be typical Alzheimer's disease, though I cannot rule out an overlap.  To simplify her medical regimen and because she had no benefit we will stop the donepezil.  She is on both donepezil and Seroquel.  I discussed with the sister that if she gains weight 1 of these medicines should be cut back. 3.    Continue Plavix. 4.    Follow-up visit in 6 months.  Hopefully we will be able to do the next visit 5.  Call sooner if new or worsening neurologic symptoms.  > 45-minute visit (15 minutes with Ms. Dubreuil and 30 minutes separately with sister) and also documentation.  Dilan Fullenwider A. Felecia Shelling, MD, PhD 3/47/4259, 5:63 PM Certified in Neurology, Clinical Neurophysiology, Sleep Medicine, Pain Medicine and Neuroimaging  [facility] [address] [place], [place] [address] [phone]

## 2019-10-05 NOTE — Telephone Encounter (Signed)
Faxed signed orders to [facility] re: "Please reduce donepezil to 5mg  daily x2weeks and then stop." Fax: [phone]. Received fax confirmation.

## 2019-10-05 NOTE — Addendum Note (Signed)
Addended by: Brooke Dare, Tomeko Scoville L on: 10/05/2019 12:00 PM   Modules accepted: Orders

## 2019-10-06 ENCOUNTER — Other Ambulatory Visit: Payer: Self-pay

## 2019-12-01 IMAGING — MR MRI CERVICAL SPINE WITHOUT CONTRAST
5 series · 31 of 48 positions shown · non-contrast
Comparison: Brain MRI report 01/01/2017-images currently not
available

CLINICAL DATA: Gait disturbance and falls.

EXAM:
MRI CERVICAL SPINE WITHOUT CONTRAST
TECHNIQUE: Multiplanar, multisequence MR imaging of the cervical spine was
performed. No intravenous contrast was administered.

[Series 4: STIR · sagittal · 3.0mm · 0.35mm/px · 8 of 13 slices shown]
[im 1/13]
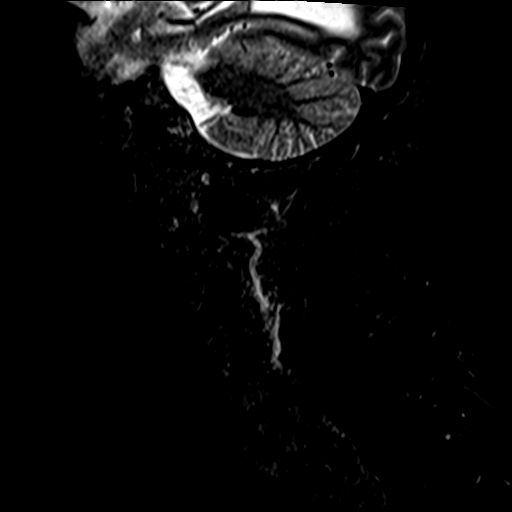
[im 2/13]
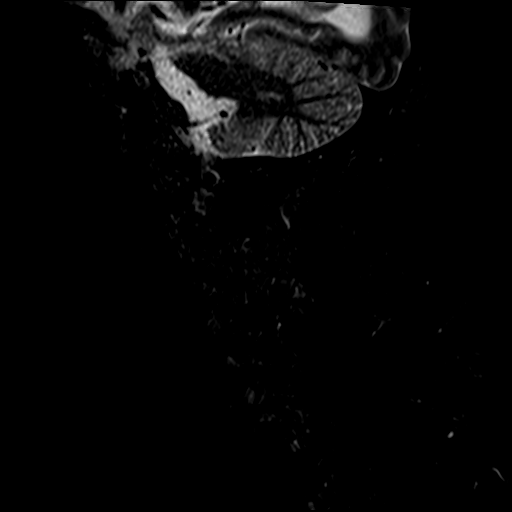
[im 4/13]
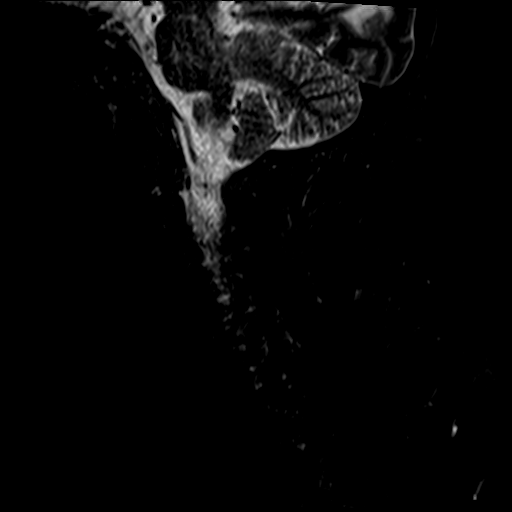
[im 6/13]
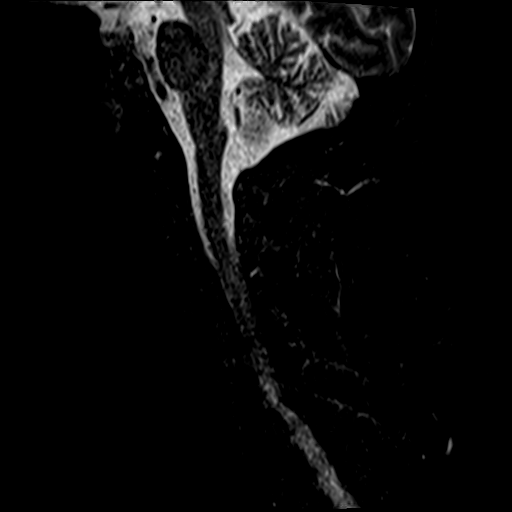
[im 7/13]
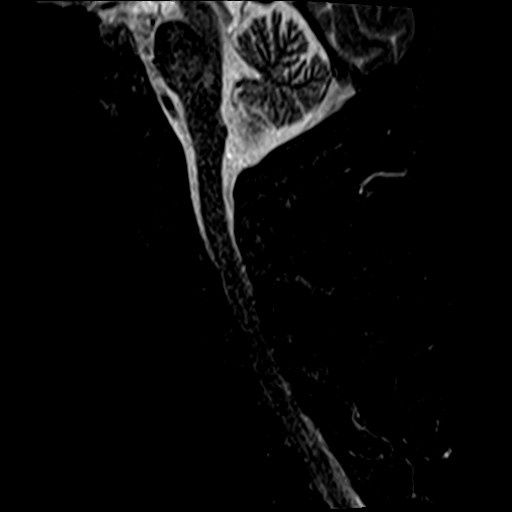
[im 9/13]
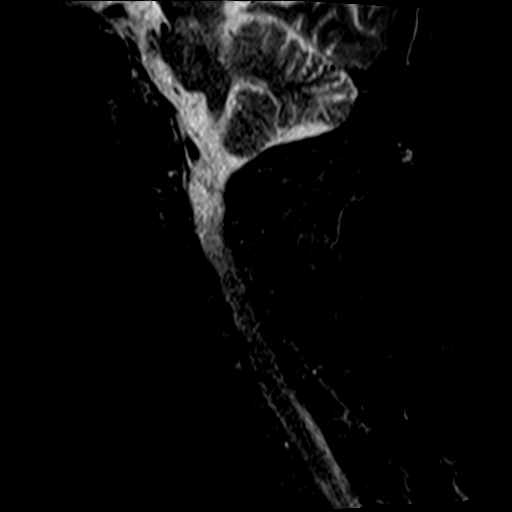
[im 11/13]
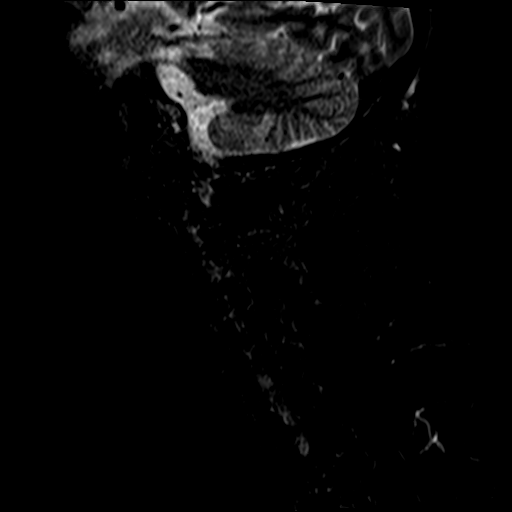
[im 13/13]
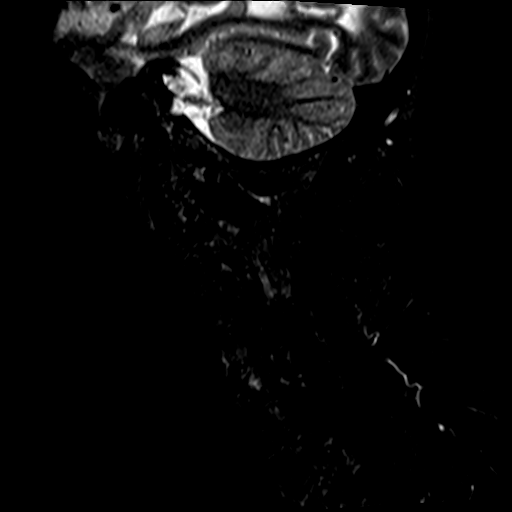

[Series 5: T2 · axial · 3.0mm · 0.62mm/px · z∈[-89,-7]mm · 8 of 24 slices shown (1 of 2)]
[im 1/24]
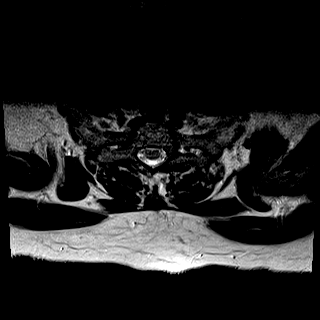
[im 4/24]
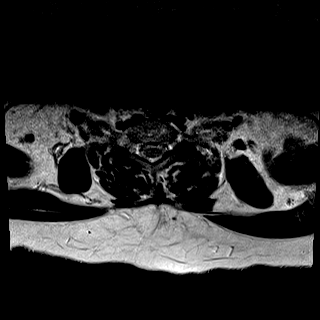
[im 8/24]
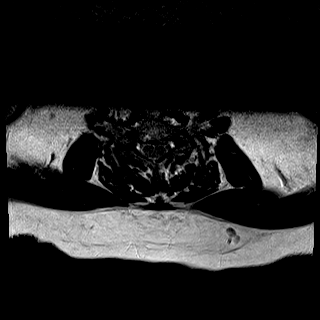
[im 11/24]
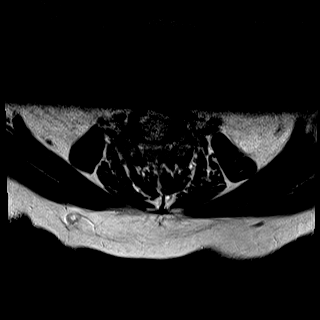
[im 13/24]
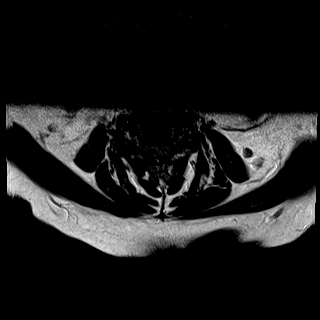
[im 16/24]
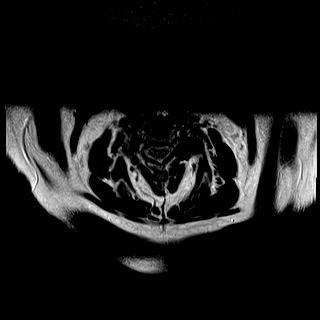
[im 20/24]
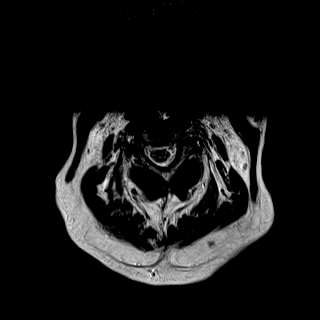
[im 24/24]
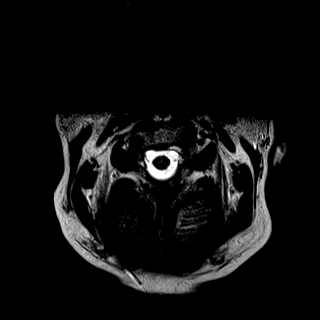

[Series 7: T2 · sagittal · 3.0mm · 0.56mm/px · 8 of 13 slices shown (2 of 2)]
[im 1/13]
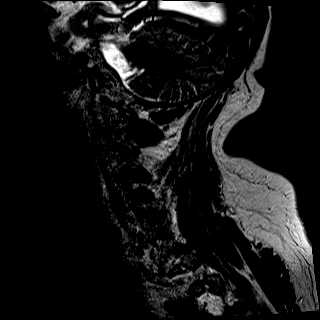
[im 2/13]
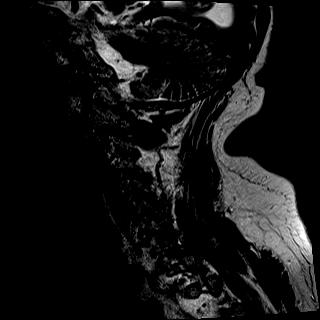
[im 4/13]
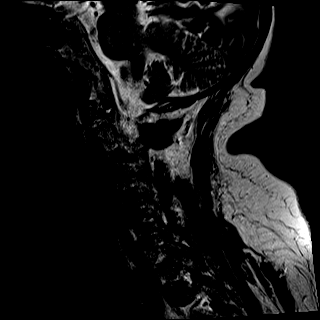
[im 6/13]
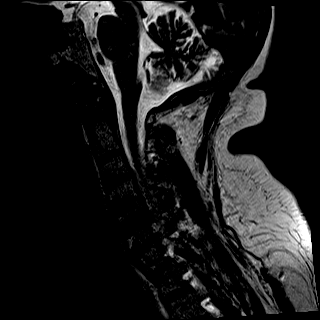
[im 7/13]
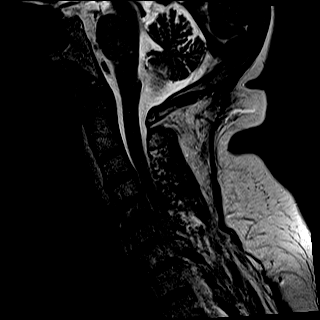
[im 9/13]
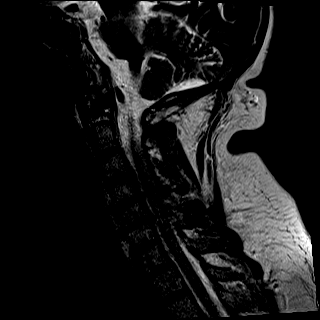
[im 11/13]
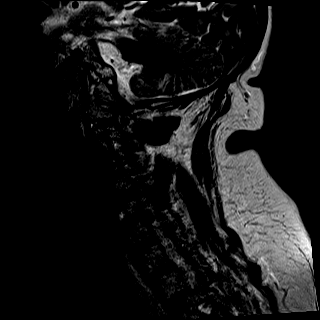
[im 13/13]
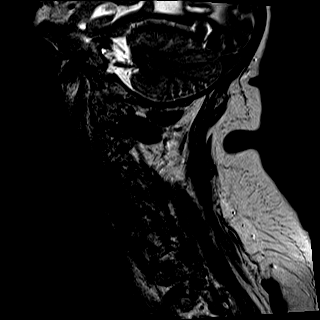

[Series 8: T1 · sagittal · 3.0mm · 0.70mm/px · 4 of 7 slices shown]
[im 1/7]
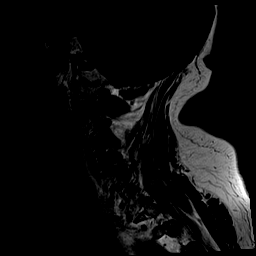
[im 3/7]
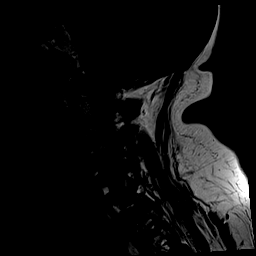
[im 5/7]
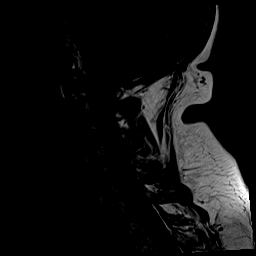
[im 7/7]
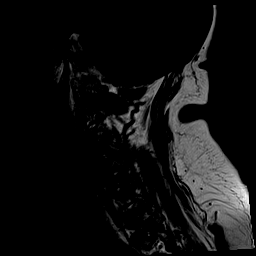

[Series 9: mpgr ax · axial · 3.0mm · 0.35mm/px · z∈[-79,-54]mm · 3 of 24 slices shown]
[im 1/24]
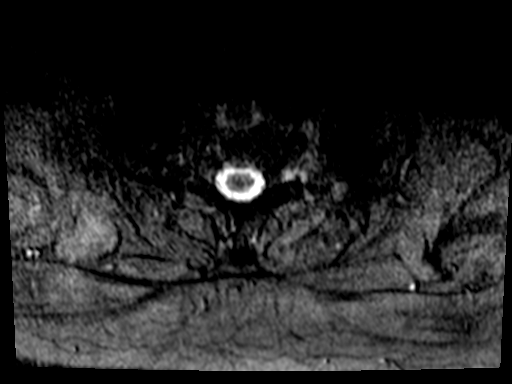
[im 4/24]
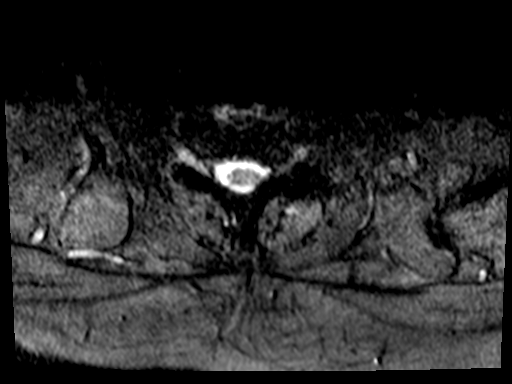
[im 8/24]
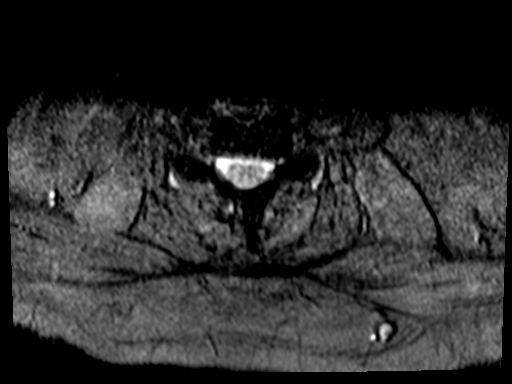

[31 of 48 positions shown; findings below may reference images not displayed]

FINDINGS: Alignment: 2-3 mm of anterolisthesis at C7-T1.

Vertebrae: No fracture, evidence of discitis, or bone lesion.

Cord: Normal signal and morphology.

Posterior Fossa, vertebral arteries, paraspinal tissues: Indistinct
T2 hyperintensity across the bilateral pons with 2 hypointense areas
within the pons likely reflecting remote microhemorrhage. These
findings were described on prior imaging report. Patient has history
of stroke and hypertension.

Disc levels:

C2-3: Unremarkable.

C3-4: Degenerative facet spurring. Uncovertebral spurring asymmetric
to the left. Mild left foraminal narrowing

C4-5: Spondylosis and disc narrowing. Mild bilateral foraminal
narrowing and mild spinal stenosis.

C5-6: Disc narrowing and circumferential bulging. Bilateral
uncovertebral spurring and foraminal disc bulging. Spinal stenosis
with slight cord flattening. Right more than left foraminal
impingement that is likely moderate

C6-7: Disc narrowing with left-sided bulge or protrusion causing
mild left foraminal stenosis (patent left foramen based on gradient
imaging). Patent spinal canal.

C7-T1:Facet spurring asymmetric to the left with mild
anterolisthesis. There is a degree of left foraminal narrowing which
appears mild on axial slices.

Motion degraded and mildly truncated study (sagittal T1 series was
truncated).
IMPRESSION: 1. Disc degeneration causes spinal stenosis with mild cord deformity
at C5-6.
2. Foraminal narrowings are described above.
3. Chronic small vessel ischemia and remote micro hemorrhages in the
pons.
4. Motion degraded study.

## 2020-04-04 ENCOUNTER — Ambulatory Visit: Payer: Self-pay | Admitting: Neurology
